# Patient Record
Sex: Male | Born: 1970 | Race: Black or African American | Hispanic: No | Marital: Single | Smoking: Current every day smoker
Health system: Southern US, Community
[De-identification: ages and names within clinical notes are randomized; demographics above are authoritative.]

## PROBLEM LIST (undated history)

## (undated) DIAGNOSIS — Z720 Tobacco use: Secondary | ICD-10-CM

## (undated) DIAGNOSIS — I10 Essential (primary) hypertension: Secondary | ICD-10-CM

---

## 2008-08-28 ENCOUNTER — Emergency Department (HOSPITAL_COMMUNITY): Admission: EM | Admit: 2008-08-28 | Discharge: 2008-08-28 | Payer: Self-pay | Admitting: Emergency Medicine

## 2009-05-30 ENCOUNTER — Emergency Department (HOSPITAL_COMMUNITY): Admission: EM | Admit: 2009-05-30 | Discharge: 2009-05-30 | Payer: Self-pay | Admitting: Family Medicine

## 2016-01-13 ENCOUNTER — Observation Stay (HOSPITAL_COMMUNITY): Payer: Self-pay

## 2016-01-13 ENCOUNTER — Encounter (HOSPITAL_COMMUNITY): Payer: Self-pay

## 2016-01-13 ENCOUNTER — Observation Stay (HOSPITAL_COMMUNITY)
Admission: EM | Admit: 2016-01-13 | Discharge: 2016-01-14 | Disposition: A | Discharge status: Home / Self Care | Payer: Self-pay | Attending: Internal Medicine | Admitting: Internal Medicine

## 2016-01-13 DIAGNOSIS — Z833 Family history of diabetes mellitus: Secondary | ICD-10-CM

## 2016-01-13 DIAGNOSIS — R030 Elevated blood-pressure reading, without diagnosis of hypertension: Secondary | ICD-10-CM

## 2016-01-13 DIAGNOSIS — R609 Edema, unspecified: Secondary | ICD-10-CM

## 2016-01-13 DIAGNOSIS — R7301 Impaired fasting glucose: Secondary | ICD-10-CM | POA: Insufficient documentation

## 2016-01-13 DIAGNOSIS — I10 Essential (primary) hypertension: Secondary | ICD-10-CM | POA: Insufficient documentation

## 2016-01-13 DIAGNOSIS — L0390 Cellulitis, unspecified: Secondary | ICD-10-CM | POA: Diagnosis present

## 2016-01-13 DIAGNOSIS — L03114 Cellulitis of left upper limb: Principal | ICD-10-CM | POA: Insufficient documentation

## 2016-01-13 DIAGNOSIS — B9689 Other specified bacterial agents as the cause of diseases classified elsewhere: Secondary | ICD-10-CM

## 2016-01-13 DIAGNOSIS — F17210 Nicotine dependence, cigarettes, uncomplicated: Secondary | ICD-10-CM | POA: Insufficient documentation

## 2016-01-13 DIAGNOSIS — F17290 Nicotine dependence, other tobacco product, uncomplicated: Secondary | ICD-10-CM

## 2016-01-13 LAB — CBC WITH DIFFERENTIAL/PLATELET
Basophils Absolute: 0 10*3/uL (ref 0.0–0.1)
Basophils Relative: 0 %
Eosinophils Absolute: 0.6 10*3/uL (ref 0.0–0.7)
Eosinophils Relative: 6 %
HEMATOCRIT: 44.5 % (ref 39.0–52.0)
HEMOGLOBIN: 15.2 g/dL (ref 13.0–17.0)
LYMPHS ABS: 1.8 10*3/uL (ref 0.7–4.0)
LYMPHS PCT: 21 %
MCH: 29.3 pg (ref 26.0–34.0)
MCHC: 34.2 g/dL (ref 30.0–36.0)
MCV: 85.7 fL (ref 78.0–100.0)
MONO ABS: 0.4 10*3/uL (ref 0.1–1.0)
MONOS PCT: 4 %
NEUTROS ABS: 6.1 10*3/uL (ref 1.7–7.7)
NEUTROS PCT: 69 %
Platelets: 243 10*3/uL (ref 150–400)
RBC: 5.19 MIL/uL (ref 4.22–5.81)
RDW: 13.4 % (ref 11.5–15.5)
WBC: 8.9 10*3/uL (ref 4.0–10.5)

## 2016-01-13 LAB — COMPREHENSIVE METABOLIC PANEL
ALBUMIN: 3.6 g/dL (ref 3.5–5.0)
ALK PHOS: 70 U/L (ref 38–126)
ALT: 29 U/L (ref 17–63)
ANION GAP: 8 (ref 5–15)
AST: 24 U/L (ref 15–41)
BUN: 13 mg/dL (ref 6–20)
CHLORIDE: 106 mmol/L (ref 101–111)
CO2: 25 mmol/L (ref 22–32)
Calcium: 9.6 mg/dL (ref 8.9–10.3)
Creatinine, Ser: 1.35 mg/dL — ABNORMAL HIGH (ref 0.61–1.24)
GFR calc Af Amer: >60 mL/min (ref >60)
GFR calc non Af Amer: >60 mL/min (ref >60)
GLUCOSE: 171 mg/dL — AB (ref 65–99)
POTASSIUM: 4.2 mmol/L (ref 3.5–5.1)
SODIUM: 139 mmol/L (ref 135–145)
Total Bilirubin: 0.4 mg/dL (ref 0.3–1.2)
Total Protein: 6.4 g/dL — ABNORMAL LOW (ref 6.5–8.1)

## 2016-01-13 LAB — I-STAT CG4 LACTIC ACID, ED: LACTIC ACID, VENOUS: 1.87 mmol/L (ref 0.5–1.9)

## 2016-01-13 MED ORDER — CEFAZOLIN IN D5W 1 GM/50ML IV SOLN
1.0000 g | Freq: Three times a day (TID) | INTRAVENOUS | Status: DC
  Administered 2016-01-13 – 2016-01-14 (×3): 1 g via INTRAVENOUS
  Filled 2016-01-13 (×5): qty 50

## 2016-01-13 MED ORDER — IBUPROFEN 800 MG PO TABS
800.0000 mg | ORAL_TABLET | Freq: Three times a day (TID) | ORAL | Status: DC
  Administered 2016-01-13 – 2016-01-14 (×3): 800 mg via ORAL
  Filled 2016-01-13 (×3): qty 1

## 2016-01-13 MED ORDER — SODIUM CHLORIDE 0.9 % IV SOLN
INTRAVENOUS | Status: AC
  Administered 2016-01-13 – 2016-01-14 (×2): via INTRAVENOUS

## 2016-01-13 MED ORDER — ENOXAPARIN SODIUM 40 MG/0.4ML ~~LOC~~ SOLN
40.0000 mg | SUBCUTANEOUS | Status: DC
  Administered 2016-01-13: 40 mg via SUBCUTANEOUS
  Filled 2016-01-13: qty 0.4

## 2016-01-13 MED ORDER — LIDOCAINE HCL (PF) 1 % IJ SOLN
5.0000 mL | Freq: Once | INTRAMUSCULAR | Status: AC
  Administered 2016-01-13: 5 mL via INTRADERMAL
  Filled 2016-01-13: qty 5

## 2016-01-13 NOTE — H&P (Signed)
Date: 01/13/2016               Patient Name:  Patrick Moody MRN: [id number]  DOB: 01/23/1971 Age / Sex: 45 y.o., male   PCP: No Pcp Per Patient         Medical Service: Internal Medicine Teaching Service         Attending Physician: Dr. Gust RungErik C Hoffman, DO    First Contact: Dr. Thomasene LotJames Britaney Espaillat, MD Pager: [phone]  Second Contact: Dr. Reggie PileVasu Rathore Pager: [phone]       After Hours (After 5p/  First Contact Pager: [phone]  weekends / holidays): Second Contact Pager: [phone]   Chief Complaint: Left hand swelling  History of Present Illness: [name] is a 45 year old African-American male with no significant or contributory past medical history who presents with a 2 day history of left hand swelling and pruritus. The patient states that he first noticed his left hand became itchy 2 days ago. Following this it began to swell up and continued to be pruritic. It continued to swell over the last 48 hours until it started to limit his mobility and hand motion. For the itching the patient tried a Benadryl cream with moderate relief and oral Benadryl with minimal relief. He only has minimal pain. He does not remember being bit by a spider or any insect. He denies being bitten or scratched by a cat. He denies rose gardening. He denies any trauma or new exposures. He does have a tiny abrasion on his hand which could possibly be the source or start of the patient's cellulitis. On review of systems he denies headache, chest pain or shortness of breath. He denies nausea, vomiting or abdominal pain. He denies diarrhea or constipation. He denies fevers, night sweats, weight loss, fatigue or joint pains.  In the emergency department the patient was evaluated and was diagnosed with a left hand cellulitis. Hand surgery was consulted and is aware of the patient. Laboratory evaluation was only significant for an elevated glucose at 171 and creatinine of 1.35. CBC was entirely within normal limits. The patient was  then admitted to the [facility] internal medicine teaching service for IV antibiotics for a left hand cellulitis.  Meds:  Current Meds  Medication Sig  . diphenhydrAMINE (BENADRYL) 25 MG tablet Take 50 mg by mouth once.  . Diphenhydramine-Zinc Acetate (BENADRYL EX) Apply 1 application topically daily as needed (itching/swelling).  Marland Kitchen. ibuprofen (ADVIL,MOTRIN) 200 MG tablet Take 400 mg by mouth every 6 (six) hours as needed for headache (pain).     Allergies: Allergies as of 01/13/2016  . (No Known Allergies)   History reviewed. No pertinent past medical history.  Family History:  1. Diabetes in the patient's mother and father  Social History: Smokes Black and mild, has approximately 2-3 drinks per week denies illicit drug use  Review of Systems: A complete ROS was negative except as per HPI.  Physical Exam: Blood pressure 138/85, pulse 72, temperature 98.4 F (36.9 C), temperature source Oral, resp. rate 18, height 6\' 1"  (1.854 m), weight 198 lb (89.8 kg), SpO2 100 %. Physical Exam  Constitutional: He is oriented to person, place, and time. He appears well-developed and well-nourished.  HENT:  Head: Normocephalic and atraumatic.  Cardiovascular: Normal rate and regular rhythm.  Exam reveals no gallop and no friction rub.   No murmur heard. Respiratory: Breath sounds normal. No respiratory distress. He has no wheezes.  GI: Soft. Bowel sounds are normal. He exhibits no  distension. There is no tenderness.  Musculoskeletal:  Patient's left hand is swollen and tender to palpation. It is also warm to palpation. There is a small incision was done in the emergency department for attempted I&D. Radial pulses intact. Sensation is intact. Motor strength is intact.  Neurological: He is alert and oriented to person, place, and time.  Skin: There is erythema.     EKG: No acute ST segment elevation  CXR: Not obtained  Assessment & Plan by Problem: Active Problems:   Cellulitis  Mr.  Patrick Moody is a [age] showed African-American male with no past medical history who presents with a left hand cellulitis  1. Left hand cellulitis Patient has a 2 day history of pruritic and swollen left hand. There is no inciting event, trauma or exposure. On examination hand is swollen and warm to palpation. Most likely etiology for the patient's left hand swelling and cellulitis. The exact underlying cause predisposing factor that with the cellulitis is unknown. We will treat with IV antibiotics and pain management. -- Cefazolin -- Ibuprofen for pain -- Left hand and forearm diagnostic radiograph  2. Elevated creatinine The patient's creatinine at time of admission was 1.35. Currently, we do notline. We will give him IV hydration overnight and repeat BMP in the morning. -- Normal saline at 100 mL per hour  3. Possible hypertension Patient had a maximum blood pressure 160/112. He has no history of hypertension. This elevated blood pressure may be secondary to pain in the setting of left hand cellulitis. We will not start antihypertensive medication at this time but will continue to monitor. -- Monitor clinically   DVT/PE prophylaxis: Lovenox FEN/GI: Regular diet  Dispo: Admit patient to Observation with expected length of stay less than 2 midnights.  Signed: Thomasene LotJames Samai Corea, MD 01/13/2016, 5:55 PM  Pager: [phone]

## 2016-01-13 NOTE — ED Triage Notes (Signed)
Patient here with left hand redness and swelling x 2 days, thinks he was bitten by spider. Now has swelling up to wrist area, no relief with ice.

## 2016-01-13 NOTE — ED Notes (Signed)
Suture cart and I&D tray to bedside

## 2016-01-13 NOTE — ED Provider Notes (Signed)
[facility]-EMERGENCY DEPT Provider Note   CSN: [id number] Arrival date & time: 01/13/16  1306  By signing my name below, I, Alyssa GroveMartin Green, attest that this documentation has been prepared under the direction and in the presence of Kyrstyn Greear Randall AnLyn Shakthi Scipio, MD. Electronically Signed: Alyssa GroveMartin Green, ED Scribe. 01/13/16. 1:53 PM.   History   Chief Complaint No chief complaint on file.  The history is provided by the patient. No language interpreter was used.   HPI Comments: Patrick Moody is a 45 y.o. male who presents to the Emergency Department complaining of gradual onset, constant left hand swelling onset 2 days. Pt notes area was pruritic to begin with and was exacerbated with application of ice. He is unsure if he was bitten. Pt works  Pt denies fever, nausea, vomiting.  History reviewed. No pertinent past medical history.  Patient Active Problem List   Diagnosis Date Noted  . Cellulitis 01/13/2016    History reviewed. No pertinent surgical history.   Home Medications    Prior to Admission medications   Medication Sig Start Date End Date Taking? Authorizing Provider  diphenhydrAMINE (BENADRYL) 25 MG tablet Take 50 mg by mouth once.   Yes Historical Provider, MD  Diphenhydramine-Zinc Acetate (BENADRYL EX) Apply 1 application topically daily as needed (itching/swelling).   Yes Historical Provider, MD  ibuprofen (ADVIL,MOTRIN) 200 MG tablet Take 400 mg by mouth every 6 (six) hours as needed for headache (pain).   Yes Historical Provider, MD    Family History No family history on file.  Social History Social History  Substance Use Topics  . Smoking status: Current Every Day Smoker  . Smokeless tobacco: Never Used  . Alcohol use Not on file    Allergies   Patient has no known allergies.  Review of Systems Review of Systems  Constitutional: Negative for fever.  Gastrointestinal: Negative for nausea and vomiting.  Musculoskeletal: Positive for joint swelling.  Skin: Negative  for color change.  All other systems reviewed and are negative.  Physical Exam Updated Vital Signs BP 138/85 (BP Location: Right Arm)   Pulse 72   Temp 98.4 F (36.9 C) (Oral)   Resp 18   Ht 6\' 1"  (1.854 m)   Wt 198 lb (89.8 kg)   SpO2 100%   BMI 26.12 kg/m   Physical Exam  Constitutional: He is oriented to person, place, and time. He appears well-developed and well-nourished. He is active. No distress.  HENT:  Head: Normocephalic and atraumatic.  Eyes: Conjunctivae are normal.  Cardiovascular: Normal rate.   Pulmonary/Chest: Effort normal. No respiratory distress.  Musculoskeletal: Normal range of motion.  Neurological: He is alert and oriented to person, place, and time.  Skin: Skin is warm and dry.  Swollen left hand in the web space between the thumb and first finger. Significant swelling with pinpoint crusting.  Psychiatric: He has a normal mood and affect. His behavior is normal.  Nursing note and vitals reviewed.       ED Treatments / Results  DIAGNOSTIC STUDIES: Oxygen Saturation is 100% on RA, normal by my interpretation.    COORDINATION OF CARE: 1:52 PM Discussed treatment plan with pt at bedside which includes ultrasound and pt agreed to plan.  Labs (all labs ordered are listed, but only abnormal results are displayed) Labs Reviewed  COMPREHENSIVE METABOLIC PANEL - Abnormal; Notable for the following:       Result Value   Glucose, Bld 171 (*)    Creatinine, Ser 1.35 (*)    Total  Protein 6.4 (*)    All other components within normal limits  CBC WITH DIFFERENTIAL/PLATELET  I-STAT CG4 LACTIC ACID, ED   EKG  EKG Interpretation None      Radiology No results found.  Procedures Procedures (including critical care time)  Medications Ordered in ED Medications  lidocaine (PF) (XYLOCAINE) 1 % injection 5 mL (5 mLs Intradermal Given 01/13/16 1502)     Initial Impression / Assessment and Plan / ED Course  I have reviewed the triage vital signs  and the nursing notes.  Pertinent labs & imaging results that were available during my care of the patient were reviewed by me and considered in my medical decision making (see chart for details).  Clinical Course as of Jan 12 1634  Wynelle LinkSun Jan 13, 2016  1459 Discussed with hand surgeon on call and will make a small incision over the area that is crusted and will then admit for IV antibiotics   [MG]    Clinical Course User Index [MG] Alyssa GroveMartin Green   I personally performed the services described in this documentation, which was scribed in my presence. The recorded information has been reviewed and is accurate.   Patient is a 45 year old male presenting with hand swelling. Patient's hand swelling has been over the last day and half. Patient says he thought it was a spider bite. Patient has significant swelling to the dorsal aspect of the left hand between the thumb and first finger. Patient has tiny punctate lesion which appears to be the head of an abscess. Ultrasound shows loculated abscess versus cellulitis. No fluctuance. We'll treat with vancomycin. Will discuss with hand surgery. Patient will likely require IV and a Biaxin admission.   Talked to on-call hand physician. He recommened opening the crusted top of abscess.  Will do so, though no fluctuance.    Will admit to medicine given extensive infection, high risk are for infection.   ULTRASOUND LIMITED SOFT TISSUE/ MUSCULOSKELETAL:  Indication: L hand abscess vs cellulitis Linear probe used to evaluate area of interest in two planes. Findings:  loculcated abscess, cobblestoning Performed by: Dr Corlis LeakMacKuen Images saved electronically  INCISION AND DRAINAGE Performed by: Arlana Hoveourteney L Kamila Broda Consent: Verbal consent obtained. Risks and benefits: risks, benefits and alternatives were discussed Type: abscess  Body area: Hand  Anesthesia: local infiltration  Incision was made with a scalpel.  Local anesthetic: lidocaine 1 w   epinephrine  Anesthetic total: 3 ml  Complexity: complex Blunt dissection to break up loculations  Drainage: blood, no purulence   Drainage amount: minimal  Patient tolerance: Patient tolerated the procedure well with no immediate complications.     Final Clinical Impressions(s) / ED Diagnoses   Final diagnoses:  None    New Prescriptions New Prescriptions   No medications on file     Kelie Gainey Randall AnLyn Homer Miller, MD 01/13/16 1635

## 2016-01-13 NOTE — ED Notes (Signed)
MD at bedside for I+D

## 2016-01-14 LAB — BASIC METABOLIC PANEL
ANION GAP: 9 (ref 5–15)
BUN: 12 mg/dL (ref 6–20)
CHLORIDE: 103 mmol/L (ref 101–111)
CO2: 24 mmol/L (ref 22–32)
Calcium: 8.8 mg/dL — ABNORMAL LOW (ref 8.9–10.3)
Creatinine, Ser: 1.39 mg/dL — ABNORMAL HIGH (ref 0.61–1.24)
GFR calc non Af Amer: 59 mL/min — ABNORMAL LOW (ref >60)
GLUCOSE: 105 mg/dL — AB (ref 65–99)
POTASSIUM: 4.1 mmol/L (ref 3.5–5.1)
Sodium: 136 mmol/L (ref 135–145)

## 2016-01-14 MED ORDER — CEPHALEXIN 500 MG PO CAPS: 500.0000 mg | ORAL_CAPSULE | Freq: Four times a day (QID) | ORAL | 0 refills | Status: DC

## 2016-01-14 NOTE — Progress Notes (Signed)
Pt discharged to home.  Discharge instructions explained to pt.  Pt has no questions at the time of discharge.  Pt states he has all belongings.  IV removed.  Pt ambulated off unit on his own.   

## 2016-01-14 NOTE — Progress Notes (Signed)
   Subjective: No acute events overnight. Patient's left hand swelling and cellulitis have improved. He is not in any pain.  Objective:  Vital signs in last 24 hours: Vitals:   01/13/16 1300 01/13/16 1312 01/13/16 1533 01/14/16 0525  BP: (!) 146/109 (!) 160/112 138/85 (!) 142/90  Pulse: 68 74 72 67  Resp:  18 18 18   Temp: 98.2 F (36.8 C) 97.6 F (36.4 C) 98.4 F (36.9 C) 98.4 F (36.9 C)  TempSrc: Oral Oral Oral Oral  SpO2: 93% 100% 100% 99%  Weight:  198 lb (89.8 kg)    Height:  6\' 1"  (1.854 m)     Physical Exam  Constitutional: He is oriented to person, place, and time. He appears well-developed and well-nourished.  HENT:  Head: Normocephalic and atraumatic.  Cardiovascular: Normal rate and regular rhythm.  Exam reveals no gallop and no friction rub.   No murmur heard. Respiratory: Effort normal and breath sounds normal. No respiratory distress. He has no wheezes.  GI: Soft. Bowel sounds are normal. He exhibits no distension. There is no tenderness.  Musculoskeletal: He exhibits edema.  Left hand remains edematous and warm to palpation. Overall, this area of swelling has improved from examination yesterday.  Neurological: He is alert and oriented to person, place, and time.     Assessment/Plan:  Active Problems:   Cellulitis  1. Left hand cellulitis, improved The patient's left hand cellulitis has improved clinically. It remains swollen and warm to palpation but the swelling has improved from prior examinations. He has good pulses and intact sensation and muscle strength. He remains afebrile and without signs of systemic infection. At this time he is medically appropriate for discharge with continued antibiotics and follow-up in clinic. -- Keflex 500 mg 4 times daily for 8 days, this will be a total antibiotic course of 10 days -- Follow-up in the [facility] internal medicine teaching clinic scheduled for 01/16/2016 at 2:15 PM -- Discharged today  2. Hypertension The  patient remains hypertensive. He has not had treatment for this before. He does not have a primary care physician. He will see me in clinic on Wednesday and we will address his cellulitis as well as his high blood pressure at that time.  Dispo: Anticipated discharge today.  Thomasene LotJames Gladine Plude, MD 01/14/2016, 10:33 AM Pager: [phone]

## 2016-01-14 NOTE — Discharge Summary (Signed)
Name: Patrick Moody MRN: [id number] DOB: [birth year] 45 y.o. PCP: No Pcp Per Patient  Date of Admission: 01/13/2016  1:35 PM Date of Discharge: 01/14/2016 Attending Physician: Gust RungErik C Hoffman, DO  Discharge Diagnosis: 1. Left hand cellulitis Active Problems:   Cellulitis   Discharge Medications:   Medication List    TAKE these medications   BENADRYL EX Apply 1 application topically daily as needed (itching/swelling).   cephALEXin 500 MG capsule Commonly known as:  KEFLEX Take 1 capsule (500 mg total) by mouth 4 (four) times daily.   diphenhydrAMINE 25 MG tablet Commonly known as:  BENADRYL Take 50 mg by mouth once.   ibuprofen 200 MG tablet Commonly known as:  ADVIL,MOTRIN Take 400 mg by mouth every 6 (six) hours as needed for headache (pain).       Disposition and follow-up:   Patrick Moody was [facility] in Good condition.  At the hospital follow up visit please address:  1.  Please ensure the patient is continuing to take his antibiotic. Please discuss with the patient if he requires blood pressure medication.  2.  Labs / imaging needed at time of follow-up: None  3.  Pending labs/ test needing follow-up: None  Follow-up Appointments: 1. He has an appointment in the [facility] internal medicine clinic for 01/16/2016 at 2:15 in the afternoon and he will see me.  Hospital Course by problem list: Active Problems:   Cellulitis   1. Left hand cellulitis The patient presented with a 2 day history of left hand pruritus, swelling and tenderness. In the emergency department laboratory work was normal. The patient was afebrile. He was admitted to the [facility] internal medicine teaching service for IV antibiotics. Once admitted the patient was treated with IV cefazolin. After one night his hand appeared improved. He was transitioned to oral cephalexin for 8 days which would be a total of 10 days of antibiotic therapy and discharged  home. He has a follow-up appointment in the [facility] internal medicine teaching clinic on 01/16/2016. I will be his primary care physician and will see him at this hospital follow-up.  2. Hypertension The patient has no diagnosis of hypertension. However, he was hypertensive during his inpatient stay. Please address this in the outpatient follow-up visit. Discharge Vitals:   BP (!) 142/90 (BP Location: Left Arm)   Pulse 67   Temp 98.4 F (36.9 C) (Oral)   Resp 18   Ht 6\' 1"  (1.854 m)   Wt 198 lb (89.8 kg)   SpO2 99%   BMI 26.12 kg/m   Pertinent Labs, Studies, and Procedures:  Left hand radiograph-no evidence of trauma, osteomyelitis or soft tissue damage  Discharge Instructions: Discharge Instructions    Diet - low sodium heart healthy    Complete by:  As directed    Discharge instructions    Complete by:  As directed    We are treating you for a skin infection called cellulitis.   I have started you on an antibiotic called Keflex. You are to take 1 pill four times daily for 8 days. Including the antibiotics you received in the hospital this will make for a total of 10 days.   Please ensure you finish ALL of the antibiotics as this is extremely important!  Also, I have arranged an appointment in the [facility] Internal Medicine teaching Clinic for this Wednesday at 2:15. Please ensure you come to this appointment.   Increase activity slowly  Complete by:  As directed       Signed: Thomasene LotJames Syanna Remmert, MD 01/14/2016, 2:07 PM   Pager: [phone]

## 2016-01-15 ENCOUNTER — Telehealth: Payer: Self-pay | Admitting: General Practice

## 2016-01-15 NOTE — Telephone Encounter (Signed)
APT. REMINDER CALL, LMTCB °

## 2016-01-16 ENCOUNTER — Ambulatory Visit (INDEPENDENT_AMBULATORY_CARE_PROVIDER_SITE_OTHER): Payer: Self-pay | Admitting: Internal Medicine

## 2016-01-16 ENCOUNTER — Encounter: Payer: Self-pay | Admitting: Internal Medicine

## 2016-01-16 VITALS — BP 151/90 | HR 87 | Temp 98.5°F | Ht 73.0 in | Wt 196.2 lb

## 2016-01-16 DIAGNOSIS — F17200 Nicotine dependence, unspecified, uncomplicated: Secondary | ICD-10-CM

## 2016-01-16 DIAGNOSIS — L03114 Cellulitis of left upper limb: Secondary | ICD-10-CM

## 2016-01-16 DIAGNOSIS — B9689 Other specified bacterial agents as the cause of diseases classified elsewhere: Secondary | ICD-10-CM

## 2016-01-16 DIAGNOSIS — I10 Essential (primary) hypertension: Secondary | ICD-10-CM

## 2016-01-16 MED ORDER — NORGESTIM-ETH ESTRAD TRIPHASIC 0.18/0.215/0.25 MG-25 MCG PO TABS: 1.0000 | ORAL_TABLET | Freq: Every day | ORAL | 11 refills | Status: DC

## 2016-01-16 MED ORDER — HYDROCHLOROTHIAZIDE 12.5 MG PO CAPS: 12.5000 mg | ORAL_CAPSULE | Freq: Every day | ORAL | 1 refills | Status: DC

## 2016-01-16 NOTE — Assessment & Plan Note (Signed)
This is a new diagnosis for the patient. He holds no former diagnosis of hypertension. However, while the patient was in the hospital his blood pressures were elevated and there was a concern that he may have underlying benign essential hypertension. At today's visit his blood pressure was 151/90. We discussed the risks and benefits of starting an antihypertensive medication and the patient was amenable to starting therapy. Because the patient is an African-American male I decided to start with low-dose hydrochlorothiazide as this antihypertensive medication has great efficacy in African-American populations, is well-tolerated and has a minimal side effect profile. In addition to starting medication I counseled the patient on lifestyle modifications. Currently he works 2 jobs that are stressful and this could be contributing to his hypertension. Additionally he states that he eats lots of high salt fast foods. We discussed the benefits of lifestyle modification including diet, exercise and eating a healthy diet. He was amenable to try these interventions as well. I will see him back in clinic in approximately 2 months to reassess his hypertension. -- Hydrochlorothiazide 12.5 mg once daily -- Continue lifestyle modification

## 2016-01-16 NOTE — Progress Notes (Signed)
   CC: Hospital follow-up for left hand cellulitis HPI: [name] is a 45 y.o. male with no contributory past medical history who presents for hospital follow-up for left hand cellulitis. Overall, he says that his hand has greatly improved. He has had some loose stools but no gross diarrhea. He denies chest pain or shortness of breath. He denies nausea, vomiting or abdominal pain. He has no additional acute complaints or concerns at today's visit.    Review of Systems: A complete ROS was negative except as per HPI.  Physical Exam: Vitals:   01/16/16 1434  BP: (!) 151/90  Pulse: 87  Temp: 98.5 F (36.9 C)  TempSrc: Oral  SpO2: 100%  Weight: 196 lb 3.2 oz (89 kg)  Height: 6\' 1"  (1.854 m)   BP (!) 151/90 (BP Location: Left Arm, Patient Position: Sitting, Cuff Size: Normal)   Pulse 87   Temp 98.5 F (36.9 C) (Oral)   Ht 6\' 1"  (1.854 m)   Wt 196 lb 3.2 oz (89 kg)   SpO2 100%   BMI 25.89 kg/m  General appearance: alert and cooperative Head: Normocephalic, without obvious abnormality, atraumatic Heart: regular rate and rhythm, S1, S2 normal, no murmur, click, rub or gallop Abdomen: soft, non-tender; bowel sounds normal; no masses,  no organomegaly Lungs: Clear to auscultation bilaterally MSK: The patient's left hand remains minimally swollen. There is no tenderness to palpation. There is no erythema or warmth appreciated on examination. Much improved from prior examination in the hospital.  Assessment & Plan:  See encounters tab for problem based medical decision making. Patient seen with Dr. Rogelia BogaButcher  Signed: Thomasene LotJames Clyde Zarrella, MD 01/16/2016, 3:39 PM  Pager: [phone]

## 2016-01-16 NOTE — Assessment & Plan Note (Signed)
Patient presents as hospital follow up for left hand cellulitis. He was admitted and stayed 1 night for IV antibiotics. He was discharged on an extended course of cephalexin. I personally took care of this patient while he was in the hospital earlier this week. His left hand cellulitis is greatly improved from prior examinations. He is tolerating the oral antibiotic well without many side effects. He does endorse mild increase of loose stools but denies diarrhea, nausea, vomiting or abdominal pain. On physical examination the cellulitis is no longer warm to touch. There is no tenderness to palpation. The patient has full range of motion and use of his left hand. He will continue and finish his antibiotic course. -- Complete cephalexin 500 mg 4 times daily

## 2016-01-16 NOTE — Patient Instructions (Signed)
It was a pleasure seeing you today. [facility] for [facility] needs.   Please finish your antibiotic.  I started a new medication for your blood pressure called hydrochlorothiazide. You are to take 1 pill once a day. Please refrain from eating salty foods. Please work on your stress level. Continue to work on diet and exercise. Please make an appointment to return to clinic in 2 months to follow-up on your blood pressure.

## 2016-01-17 NOTE — Progress Notes (Signed)
Internal Medicine Clinic Attending  I saw and evaluated the patient.  I personally confirmed the key portions of the history and exam documented by Dr. Taylor and I reviewed pertinent patient test results.  The assessment, diagnosis, and plan were formulated together and I agree with the documentation in the resident's note.  

## 2016-07-21 ENCOUNTER — Encounter: Payer: Self-pay | Admitting: *Deleted

## 2017-07-19 ENCOUNTER — Other Ambulatory Visit: Payer: Self-pay

## 2017-07-19 ENCOUNTER — Emergency Department (HOSPITAL_COMMUNITY)
Admission: EM | Admit: 2017-07-19 | Discharge: 2017-07-19 | Disposition: A | Discharge status: Home / Self Care | Payer: No Typology Code available for payment source | Attending: Emergency Medicine | Admitting: Emergency Medicine

## 2017-07-19 ENCOUNTER — Encounter (HOSPITAL_COMMUNITY): Payer: Self-pay | Admitting: Emergency Medicine

## 2017-07-19 DIAGNOSIS — Z23 Encounter for immunization: Secondary | ICD-10-CM | POA: Diagnosis not present

## 2017-07-19 DIAGNOSIS — Y999 Unspecified external cause status: Secondary | ICD-10-CM | POA: Insufficient documentation

## 2017-07-19 DIAGNOSIS — S0990XA Unspecified injury of head, initial encounter: Secondary | ICD-10-CM | POA: Diagnosis present

## 2017-07-19 DIAGNOSIS — F17200 Nicotine dependence, unspecified, uncomplicated: Secondary | ICD-10-CM | POA: Insufficient documentation

## 2017-07-19 DIAGNOSIS — Y9389 Activity, other specified: Secondary | ICD-10-CM | POA: Insufficient documentation

## 2017-07-19 DIAGNOSIS — Z79899 Other long term (current) drug therapy: Secondary | ICD-10-CM | POA: Insufficient documentation

## 2017-07-19 DIAGNOSIS — I10 Essential (primary) hypertension: Secondary | ICD-10-CM | POA: Insufficient documentation

## 2017-07-19 DIAGNOSIS — Y929 Unspecified place or not applicable: Secondary | ICD-10-CM | POA: Diagnosis not present

## 2017-07-19 DIAGNOSIS — S0104XA Puncture wound with foreign body of scalp, initial encounter: Secondary | ICD-10-CM | POA: Diagnosis not present

## 2017-07-19 MED ORDER — IBUPROFEN 400 MG PO TABS
600.0000 mg | ORAL_TABLET | Freq: Once | ORAL | Status: AC
  Administered 2017-07-19: 600 mg via ORAL
  Filled 2017-07-19: qty 1

## 2017-07-19 MED ORDER — TETANUS-DIPHTH-ACELL PERTUSSIS 5-2.5-18.5 LF-MCG/0.5 IM SUSP
0.5000 mL | Freq: Once | INTRAMUSCULAR | Status: AC
  Administered 2017-07-19: 0.5 mL via INTRAMUSCULAR
  Filled 2017-07-19: qty 0.5

## 2017-07-19 MED ORDER — CYCLOBENZAPRINE HCL 10 MG PO TABS: 10.0000 mg | ORAL_TABLET | Freq: Two times a day (BID) | ORAL | 0 refills | Status: DC | PRN
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
## 2017-07-19 NOTE — ED Notes (Signed)
ED Provider at bedside. 
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
## 2018-08-16 ENCOUNTER — Other Ambulatory Visit: Payer: Self-pay

## 2018-08-16 ENCOUNTER — Emergency Department (HOSPITAL_COMMUNITY): Payer: BC Managed Care – PPO

## 2018-08-16 ENCOUNTER — Inpatient Hospital Stay (HOSPITAL_COMMUNITY)
Admission: EM | Admit: 2018-08-16 | Discharge: 2018-08-19 | DRG: 637 | Disposition: A | Discharge status: Home / Self Care | Payer: BC Managed Care – PPO | Attending: Internal Medicine | Admitting: Internal Medicine

## 2018-08-16 ENCOUNTER — Encounter (HOSPITAL_COMMUNITY): Payer: Self-pay | Admitting: Emergency Medicine

## 2018-08-16 DIAGNOSIS — M6282 Rhabdomyolysis: Secondary | ICD-10-CM | POA: Diagnosis present

## 2018-08-16 DIAGNOSIS — E119 Type 2 diabetes mellitus without complications: Secondary | ICD-10-CM

## 2018-08-16 DIAGNOSIS — E872 Acidosis, unspecified: Secondary | ICD-10-CM | POA: Diagnosis present

## 2018-08-16 DIAGNOSIS — Z79899 Other long term (current) drug therapy: Secondary | ICD-10-CM

## 2018-08-16 DIAGNOSIS — E860 Dehydration: Secondary | ICD-10-CM

## 2018-08-16 DIAGNOSIS — E1010 Type 1 diabetes mellitus with ketoacidosis without coma: Secondary | ICD-10-CM | POA: Diagnosis not present

## 2018-08-16 DIAGNOSIS — I10 Essential (primary) hypertension: Secondary | ICD-10-CM | POA: Diagnosis present

## 2018-08-16 DIAGNOSIS — E1110 Type 2 diabetes mellitus with ketoacidosis without coma: Secondary | ICD-10-CM | POA: Diagnosis not present

## 2018-08-16 DIAGNOSIS — Z8249 Family history of ischemic heart disease and other diseases of the circulatory system: Secondary | ICD-10-CM | POA: Diagnosis not present

## 2018-08-16 DIAGNOSIS — F17200 Nicotine dependence, unspecified, uncomplicated: Secondary | ICD-10-CM | POA: Diagnosis present

## 2018-08-16 DIAGNOSIS — E870 Hyperosmolality and hypernatremia: Secondary | ICD-10-CM | POA: Diagnosis present

## 2018-08-16 DIAGNOSIS — Z833 Family history of diabetes mellitus: Secondary | ICD-10-CM | POA: Diagnosis not present

## 2018-08-16 DIAGNOSIS — Z791 Long term (current) use of non-steroidal anti-inflammatories (NSAID): Secondary | ICD-10-CM

## 2018-08-16 DIAGNOSIS — E1011 Type 1 diabetes mellitus with ketoacidosis with coma: Secondary | ICD-10-CM | POA: Diagnosis not present

## 2018-08-16 DIAGNOSIS — D72829 Elevated white blood cell count, unspecified: Secondary | ICD-10-CM | POA: Diagnosis present

## 2018-08-16 DIAGNOSIS — E785 Hyperlipidemia, unspecified: Secondary | ICD-10-CM | POA: Diagnosis present

## 2018-08-16 DIAGNOSIS — G9341 Metabolic encephalopathy: Secondary | ICD-10-CM

## 2018-08-16 DIAGNOSIS — N179 Acute kidney failure, unspecified: Secondary | ICD-10-CM | POA: Diagnosis present

## 2018-08-16 DIAGNOSIS — Z1159 Encounter for screening for other viral diseases: Secondary | ICD-10-CM | POA: Diagnosis not present

## 2018-08-16 DIAGNOSIS — R739 Hyperglycemia, unspecified: Secondary | ICD-10-CM | POA: Diagnosis present

## 2018-08-16 DIAGNOSIS — Z720 Tobacco use: Secondary | ICD-10-CM | POA: Diagnosis present

## 2018-08-16 DIAGNOSIS — E861 Hypovolemia: Secondary | ICD-10-CM | POA: Diagnosis present

## 2018-08-16 DIAGNOSIS — E871 Hypo-osmolality and hyponatremia: Secondary | ICD-10-CM | POA: Diagnosis present

## 2018-08-16 HISTORY — DX: Essential (primary) hypertension: I10

## 2018-08-16 HISTORY — DX: Tobacco use: Z72.0

## 2018-08-16 LAB — COMPREHENSIVE METABOLIC PANEL
ALT: 42 U/L (ref 0–44)
AST: 26 U/L (ref 15–41)
Albumin: 4.7 g/dL (ref 3.5–5.0)
Alkaline Phosphatase: 146 U/L — ABNORMAL HIGH (ref 38–126)
Anion gap: 24 — ABNORMAL HIGH (ref 5–15)
BUN: 60 mg/dL — ABNORMAL HIGH (ref 6–20)
CO2: 19 mmol/L — ABNORMAL LOW (ref 22–32)
Calcium: 10.6 mg/dL — ABNORMAL HIGH (ref 8.9–10.3)
Chloride: 97 mmol/L — ABNORMAL LOW (ref 98–111)
Creatinine, Ser: 3.15 mg/dL — ABNORMAL HIGH (ref 0.61–1.24)
GFR calc Af Amer: 26 mL/min — ABNORMAL LOW (ref >60)
GFR calc non Af Amer: 22 mL/min — ABNORMAL LOW (ref >60)
Glucose, Bld: 1128 mg/dL (ref 70–99)
Potassium: 5.2 mmol/L — ABNORMAL HIGH (ref 3.5–5.1)
Sodium: 140 mmol/L (ref 135–145)
Total Bilirubin: 1.3 mg/dL — ABNORMAL HIGH (ref 0.3–1.2)
Total Protein: 9.6 g/dL — ABNORMAL HIGH (ref 6.5–8.1)

## 2018-08-16 LAB — CBC WITH DIFFERENTIAL/PLATELET
Abs Immature Granulocytes: 0.05 10*3/uL (ref 0.00–0.07)
Basophils Absolute: 0 10*3/uL (ref 0.0–0.1)
Basophils Relative: 0 %
Eosinophils Absolute: 0 10*3/uL (ref 0.0–0.5)
Eosinophils Relative: 0 %
HCT: 56.7 % — ABNORMAL HIGH (ref 39.0–52.0)
Hemoglobin: 16.9 g/dL (ref 13.0–17.0)
Immature Granulocytes: 0 %
Lymphocytes Relative: 7 %
Lymphs Abs: 0.9 10*3/uL (ref 0.7–4.0)
MCH: 26.7 pg (ref 26.0–34.0)
MCHC: 29.8 g/dL — ABNORMAL LOW (ref 30.0–36.0)
MCV: 89.7 fL (ref 80.0–100.0)
Monocytes Absolute: 0.7 10*3/uL (ref 0.1–1.0)
Monocytes Relative: 5 %
Neutro Abs: 11.1 10*3/uL — ABNORMAL HIGH (ref 1.7–7.7)
Neutrophils Relative %: 88 %
Platelets: 463 10*3/uL — ABNORMAL HIGH (ref 150–400)
RBC: 6.32 MIL/uL — ABNORMAL HIGH (ref 4.22–5.81)
RDW: 12.6 % (ref 11.5–15.5)
WBC: 12.8 10*3/uL — ABNORMAL HIGH (ref 4.0–10.5)
nRBC: 0 % (ref 0.0–0.2)

## 2018-08-16 LAB — BASIC METABOLIC PANEL
Anion gap: 21 — ABNORMAL HIGH (ref 5–15)
BUN: 58 mg/dL — ABNORMAL HIGH (ref 6–20)
CO2: 15 mmol/L — ABNORMAL LOW (ref 22–32)
Calcium: 9.2 mg/dL (ref 8.9–10.3)
Chloride: 113 mmol/L — ABNORMAL HIGH (ref 98–111)
Creatinine, Ser: 2.68 mg/dL — ABNORMAL HIGH (ref 0.61–1.24)
GFR calc Af Amer: 31 mL/min — ABNORMAL LOW (ref >60)
GFR calc non Af Amer: 27 mL/min — ABNORMAL LOW (ref >60)
Glucose, Bld: 820 mg/dL (ref 70–99)
Potassium: 4.8 mmol/L (ref 3.5–5.1)
Sodium: 149 mmol/L — ABNORMAL HIGH (ref 135–145)

## 2018-08-16 LAB — BLOOD GAS, ARTERIAL
Acid-base deficit: 10.4 mmol/L — ABNORMAL HIGH (ref 0.0–2.0)
Bicarbonate: 16 mmol/L — ABNORMAL LOW (ref 20.0–28.0)
Drawn by: 441261
FIO2: 21
O2 Saturation: 92.9 %
Patient temperature: 98.6
pCO2 arterial: 37.9 mmHg (ref 32.0–48.0)
pH, Arterial: 7.25 — ABNORMAL LOW (ref 7.350–7.450)
pO2, Arterial: 77.3 mmHg — ABNORMAL LOW (ref 83.0–108.0)

## 2018-08-16 LAB — URINALYSIS, ROUTINE W REFLEX MICROSCOPIC
Bilirubin Urine: NEGATIVE
Glucose, UA: >=500 mg/dL — AB
Ketones, ur: 5 mg/dL — AB
Leukocytes,Ua: NEGATIVE
Nitrite: NEGATIVE
Protein, ur: 100 mg/dL — AB
Specific Gravity, Urine: 1.029 (ref 1.005–1.030)
pH: 5 (ref 5.0–8.0)

## 2018-08-16 LAB — GLUCOSE, CAPILLARY
Glucose-Capillary: >600 mg/dL (ref 70–99)
Glucose-Capillary: >600 mg/dL (ref 70–99)
Glucose-Capillary: >600 mg/dL (ref 70–99)

## 2018-08-16 LAB — I-STAT CHEM 8, ED
BUN: 57 mg/dL — ABNORMAL HIGH (ref 6–20)
Calcium, Ion: 1.25 mmol/L (ref 1.15–1.40)
Chloride: 107 mmol/L (ref 98–111)
Creatinine, Ser: 2.8 mg/dL — ABNORMAL HIGH (ref 0.61–1.24)
Glucose, Bld: >700 mg/dL (ref 70–99)
HCT: 52 % (ref 39.0–52.0)
Hemoglobin: 17.7 g/dL — ABNORMAL HIGH (ref 13.0–17.0)
Potassium: 5.1 mmol/L (ref 3.5–5.1)
Sodium: 140 mmol/L (ref 135–145)
TCO2: 21 mmol/L — ABNORMAL LOW (ref 22–32)

## 2018-08-16 LAB — PROTIME-INR
INR: 1.1 (ref 0.8–1.2)
Prothrombin Time: 13.7 seconds (ref 11.4–15.2)

## 2018-08-16 LAB — PROCALCITONIN: Procalcitonin: 0.53 ng/mL

## 2018-08-16 LAB — RAPID URINE DRUG SCREEN, HOSP PERFORMED
Amphetamines: NOT DETECTED
Barbiturates: NOT DETECTED
Benzodiazepines: NOT DETECTED
Cocaine: NOT DETECTED
Opiates: NOT DETECTED
Tetrahydrocannabinol: NOT DETECTED

## 2018-08-16 LAB — ETHANOL: Alcohol, Ethyl (B): <10 mg/dL (ref <10)

## 2018-08-16 LAB — LACTIC ACID, PLASMA
Lactic Acid, Venous: 4 mmol/L (ref 0.5–1.9)
Lactic Acid, Venous: 3.6 mmol/L (ref 0.5–1.9)
Lactic Acid, Venous: 2.8 mmol/L (ref 0.5–1.9)

## 2018-08-16 LAB — LIPASE, BLOOD: Lipase: 48 U/L (ref 11–51)

## 2018-08-16 LAB — CBG MONITORING, ED
Glucose-Capillary: >600 mg/dL (ref 70–99)
Glucose-Capillary: >600 mg/dL (ref 70–99)

## 2018-08-16 LAB — MRSA PCR SCREENING: MRSA by PCR: NEGATIVE

## 2018-08-16 LAB — SARS CORONAVIRUS 2 BY RT PCR (HOSPITAL ORDER, PERFORMED IN ~~LOC~~ HOSPITAL LAB): SARS Coronavirus 2: NEGATIVE

## 2018-08-16 LAB — CK: Total CK: 1269 U/L — ABNORMAL HIGH (ref 49–397)

## 2018-08-16 MED ORDER — LACTATED RINGERS IV BOLUS
1000.0000 mL | Freq: Once | INTRAVENOUS | Status: AC
  Administered 2018-08-17: 1000 mL via INTRAVENOUS

## 2018-08-16 MED ORDER — SODIUM CHLORIDE 0.9 % IV SOLN
INTRAVENOUS | Status: DC
  Administered 2018-08-16: 22:00:00 via INTRAVENOUS

## 2018-08-16 MED ORDER — DEXTROSE-NACL 5-0.45 % IV SOLN: INTRAVENOUS | Status: DC

## 2018-08-16 MED ORDER — HYDRALAZINE HCL 20 MG/ML IJ SOLN: 5.0000 mg | INTRAMUSCULAR | Status: DC | PRN

## 2018-08-16 MED ORDER — SODIUM CHLORIDE 0.9 % IV BOLUS
2000.0000 mL | Freq: Once | INTRAVENOUS | Status: AC
  Administered 2018-08-16: 2000 mL via INTRAVENOUS

## 2018-08-16 MED ORDER — SODIUM CHLORIDE 0.9 % IV BOLUS
1000.0000 mL | Freq: Once | INTRAVENOUS | Status: AC
  Administered 2018-08-16: 1000 mL via INTRAVENOUS

## 2018-08-16 MED ORDER — INSULIN REGULAR(HUMAN) IN NACL 100-0.9 UT/100ML-% IV SOLN: INTRAVENOUS | Status: DC

## 2018-08-16 MED ORDER — HEPARIN SODIUM (PORCINE) 5000 UNIT/ML IJ SOLN
5000.0000 [IU] | Freq: Three times a day (TID) | INTRAMUSCULAR | Status: DC
  Administered 2018-08-16 – 2018-08-19 (×8): 5000 [IU] via SUBCUTANEOUS
  Filled 2018-08-16 (×10): qty 1

## 2018-08-16 MED ORDER — NICOTINE 21 MG/24HR TD PT24
21.0000 mg | MEDICATED_PATCH | Freq: Every day | TRANSDERMAL | Status: DC
  Administered 2018-08-17 – 2018-08-19 (×3): 21 mg via TRANSDERMAL
  Filled 2018-08-16 (×3): qty 1

## 2018-08-16 MED ORDER — SODIUM CHLORIDE 0.9 % IV BOLUS
500.0000 mL | Freq: Once | INTRAVENOUS | Status: AC
  Administered 2018-08-16: 500 mL via INTRAVENOUS

## 2018-08-16 MED ORDER — ACETAMINOPHEN 650 MG RE SUPP: 650.0000 mg | Freq: Four times a day (QID) | RECTAL | Status: DC | PRN

## 2018-08-16 MED ORDER — ONDANSETRON HCL 4 MG/2ML IJ SOLN
4.0000 mg | Freq: Three times a day (TID) | INTRAMUSCULAR | Status: DC | PRN
  Administered 2018-08-17: 4 mg via INTRAVENOUS
  Filled 2018-08-16: qty 2

## 2018-08-16 MED ORDER — INSULIN REGULAR(HUMAN) IN NACL 100-0.9 UT/100ML-% IV SOLN
INTRAVENOUS | Status: DC
  Administered 2018-08-16: 5.4 [IU]/h via INTRAVENOUS
  Filled 2018-08-16 (×3): qty 100

## 2018-08-16 NOTE — ED Notes (Signed)
ED TO INPATIENT HANDOFF REPORT  Name/Age/Gender Patrick Moody 48 y.o. male  Code Status    Code Status Orders  (From admission, onward)         Start     Ordered   08/16/18 1922  Full code  Continuous     08/16/18 1922        Code Status History    Date Active Date Inactive Code Status Order ID Comments User Context   01/13/2016 1743 01/14/2016 1917 Full Code [id number]  [name], Vasundhra, MD Inpatient   Advance Care Planning Activity      Home/SNF/Other Home  Chief Complaint weakness  Level of Care/Admitting Diagnosis ED Disposition    ED Disposition Condition Comment   Admit  [facility] [[id number]]  Level of Care: Stepdown [14]  Admit to SDU based on following criteria: Other see comments  Comments: DKA  Covid Evaluation: Asymptomatic Screening Protocol (No Symptoms)  Diagnosis: DKA, type 1 [facility](HCC) [[id number]][[id number]]  Admitting Physician: Santiago BumpersNIU, XILIN [[id number]]  Attending Physician: Lorretta HarpNIU, XILIN [phone][[id number]]  Estimated length of stay: past midnight tomorrow  Certification:: I certify this patient will need inpatient services for at least 2 midnights  PT Class (Do Not Modify): Inpatient [101]  PT Acc Code (Do Not Modify): Private [1]       Medical History Past Medical History:  Diagnosis Date  . Hypertension   . Tobacco abuse     Allergies No Known Allergies  IV Location/Drains/Wounds Patient Lines/Drains/Airways Status   Active Line/Drains/Airways    Name:   Placement date:   Placement time:   Site:   Days:   Peripheral IV 08/16/18 Right Antecubital   08/16/18    1728    Antecubital   less than 1          Labs/Imaging Results for orders placed or performed during the hospital encounter of 08/16/18 (from the past 48 hour(s))  CBG monitoring, ED     Status: Abnormal   Collection Time: 08/16/18  5:02 PM  Result Value Ref Range   Glucose-Capillary >600 (HH) 70 - 99 mg/dL  Blood gas, arterial     Status: Abnormal   Collection Time:  08/16/18  5:27 PM  Result Value Ref Range   FIO2 21.00    Delivery systems ROOM AIR    pH, Arterial 7.250 (L) 7.350 - 7.450   pCO2 arterial 37.9 32.0 - 48.0 mmHg   pO2, Arterial 77.3 (L) 83.0 - 108.0 mmHg   Bicarbonate 16.0 (L) 20.0 - 28.0 mmol/L   Acid-base deficit 10.4 (H) 0.0 - 2.0 mmol/L   O2 Saturation 92.9 %   Patient temperature 98.6    Collection site LEFT BRACHIAL    Drawn by [phone]    Sample type ARTERIAL DRAW    Allens test (pass/fail) PASS PASS    Comment: Performed at [facility], [address] [place], [place] [address]  Comprehensive metabolic panel     Status: Abnormal   Collection Time: 08/16/18  5:29 PM  Result Value Ref Range   Sodium 140 135 - 145 mmol/L   Potassium 5.2 (H) 3.5 - 5.1 mmol/L   Chloride 97 (L) 98 - 111 mmol/L   CO2 19 (L) 22 - 32 mmol/L   Glucose, Bld 1,128 (HH) 70 - 99 mg/dL    Comment: CRITICAL RESULT CALLED TO, READ BACK BY AND VERIFIED WITH: Claretta FraiseMORGAN ROSSER,RN [phone] @ 1842 BY J SCOTTON    BUN 60 (H) 6 - 20 mg/dL  Creatinine, Ser 3.15 (H) 0.61 - 1.24 mg/dL   Calcium 16.1 (H) 8.9 - 10.3 mg/dL   Total Protein 9.6 (H) 6.5 - 8.1 g/dL   Albumin 4.7 3.5 - 5.0 g/dL   AST 26 15 - 41 U/L   ALT 42 0 - 44 U/L   Alkaline Phosphatase 146 (H) 38 - 126 U/L   Total Bilirubin 1.3 (H) 0.3 - 1.2 mg/dL   GFR calc non Af Amer 22 (L) >60 mL/min   GFR calc Af Amer 26 (L) >60 mL/min   Anion gap 24 (H) 5 - 15    Comment: RESULT CHECKED Performed at [facility], [address], [place], [place] [address]   CBC with Differential     Status: Abnormal   Collection Time: 08/16/18  5:29 PM  Result Value Ref Range   WBC 12.8 (H) 4.0 - 10.5 K/uL   RBC 6.32 (H) 4.22 - 5.81 MIL/uL   Hemoglobin 16.9 13.0 - 17.0 g/dL   HCT 54.0 (H) 98.1 - 19.1 %   MCV 89.7 80.0 - 100.0 fL   MCH 26.7 26.0 - 34.0 pg   MCHC 29.8 (L) 30.0 - 36.0 g/dL   RDW 47.8 29.5 - 62.1 %   Platelets 463 (H) 150 - 400 K/uL   nRBC 0.0 0.0 - 0.2 %    Neutrophils Relative % 88 %   Neutro Abs 11.1 (H) 1.7 - 7.7 K/uL   Lymphocytes Relative 7 %   Lymphs Abs 0.9 0.7 - 4.0 K/uL   Monocytes Relative 5 %   Monocytes Absolute 0.7 0.1 - 1.0 K/uL   Eosinophils Relative 0 %   Eosinophils Absolute 0.0 0.0 - 0.5 K/uL   Basophils Relative 0 %   Basophils Absolute 0.0 0.0 - 0.1 K/uL   Immature Granulocytes 0 %   Abs Immature Granulocytes 0.05 0.00 - 0.07 K/uL    Comment: Performed at [facility], [address], [place], [place] [address]  Lactic acid, plasma     Status: Abnormal   Collection Time: 08/16/18  5:29 PM  Result Value Ref Range   Lactic Acid, Venous 4.0 (HH) 0.5 - 1.9 mmol/L    Comment: CRITICAL RESULT CALLED TO, READ BACK BY AND VERIFIED WITH: Claretta Fraise [id number] @ 1816 BY Knute Neu Performed at [facility], [address] [place], [place] [address]   Protime-INR     Status: None   Collection Time: 08/16/18  5:29 PM  Result Value Ref Range   Prothrombin Time 13.7 11.4 - 15.2 seconds   INR 1.1 0.8 - 1.2    Comment: (NOTE) INR goal varies based on device and disease states. Performed at [facility], [address], [place], [place] [address]   Ethanol     Status: None   Collection Time: 08/16/18  5:30 PM  Result Value Ref Range   Alcohol, Ethyl (B) <10 <10 mg/dL    Comment: (NOTE) Lowest detectable limit for serum alcohol is 10 mg/dL. For medical purposes only. Performed at [facility], [address] [place], [place] [address]   I-stat chem 8, ED (not at [facility] or [facility])     Status: Abnormal   Collection Time: 08/16/18  5:46 PM  Result Value Ref Range   Sodium 140 135 - 145 mmol/L   Potassium 5.1 3.5 - 5.1 mmol/L   Chloride 107 98 - 111 mmol/L   BUN 57 (H) 6 - 20 mg/dL  Creatinine, Ser 2.80 (H) 0.61 - 1.24 mg/dL   Glucose, Bld >409>700 (HH) 70 - 99 mg/dL   Calcium, Ion 8.111.25 9.141.15 - 1.40 mmol/L   TCO2 21 (L) 22 - 32 mmol/L   Hemoglobin  17.7 (H) 13.0 - 17.0 g/dL   HCT 78.252.0 95.639.0 - 21.352.0 %   Comment NOTIFIED PHYSICIAN   Urine rapid drug screen (hosp performed)     Status: None   Collection Time: 08/16/18  6:00 PM  Result Value Ref Range   Opiates NONE DETECTED NONE DETECTED   Cocaine NONE DETECTED NONE DETECTED   Benzodiazepines NONE DETECTED NONE DETECTED   Amphetamines NONE DETECTED NONE DETECTED   Tetrahydrocannabinol NONE DETECTED NONE DETECTED   Barbiturates NONE DETECTED NONE DETECTED    Comment: (NOTE) DRUG SCREEN FOR MEDICAL PURPOSES ONLY.  IF CONFIRMATION IS NEEDED FOR ANY PURPOSE, NOTIFY LAB WITHIN 5 DAYS. LOWEST DETECTABLE LIMITS FOR URINE DRUG SCREEN Drug Class                     Cutoff (ng/mL) Amphetamine and metabolites    1000 Barbiturate and metabolites    200 Benzodiazepine                 200 Tricyclics and metabolites     300 Opiates and metabolites        300 Cocaine and metabolites        300 THC                            50 Performed at [facility], [address] [place], [place] [address]   Urinalysis, Routine w reflex microscopic     Status: Abnormal   Collection Time: 08/16/18  6:00 PM  Result Value Ref Range   Color, Urine YELLOW YELLOW   APPearance CLEAR CLEAR   Specific Gravity, Urine 1.029 1.005 - 1.030   pH 5.0 5.0 - 8.0   Glucose, UA >=500 (A) NEGATIVE mg/dL   Hgb urine dipstick LARGE (A) NEGATIVE   Bilirubin Urine NEGATIVE NEGATIVE   Ketones, ur 5 (A) NEGATIVE mg/dL   Protein, ur 846100 (A) NEGATIVE mg/dL   Nitrite NEGATIVE NEGATIVE   Leukocytes,Ua NEGATIVE NEGATIVE   RBC / HPF 0-5 0 - 5 RBC/hpf   WBC, UA 0-5 0 - 5 WBC/hpf   Bacteria, UA RARE (A) NONE SEEN   Squamous Epithelial / LPF 0-5 0 - 5   Mucus PRESENT    Hyaline Casts, UA PRESENT     Comment: Performed at [facility], [address] [place], [place] [address]  SARS Coronavirus 2 (CEPHEID - Performed in [facility] hospital lab), Hosp Order     Status: None   Collection  Time: 08/16/18  6:03 PM   Specimen: Nasopharyngeal Swab  Result Value Ref Range   SARS Coronavirus 2 NEGATIVE NEGATIVE    Comment: (NOTE) If result is NEGATIVE SARS-CoV-2 target nucleic acids are NOT DETECTED. The SARS-CoV-2 RNA is generally detectable in upper and lower  respiratory specimens during the acute phase of infection. The lowest  concentration of SARS-CoV-2 viral copies this assay can detect is 250  copies / mL. A negative result does not preclude SARS-CoV-2 infection  and should not be used as the sole basis for treatment or other  patient management decisions.  A negative result may occur with  improper specimen collection / handling, submission of specimen other  than nasopharyngeal swab,  presence of viral mutation(s) within the  areas targeted by this assay, and inadequate number of viral copies  (<250 copies / mL). A negative result must be combined with clinical  observations, patient history, and epidemiological information. If result is POSITIVE SARS-CoV-2 target nucleic acids are DETECTED. The SARS-CoV-2 RNA is generally detectable in upper and lower  respiratory specimens dur ing the acute phase of infection.  Positive  results are indicative of active infection with SARS-CoV-2.  Clinical  correlation with patient history and other diagnostic information is  necessary to determine patient infection status.  Positive results do  not rule out bacterial infection or co-infection with other viruses. If result is PRESUMPTIVE POSTIVE SARS-CoV-2 nucleic acids MAY BE PRESENT.   A presumptive positive result was obtained on the submitted specimen  and confirmed on repeat testing.  While 2019 novel coronavirus  (SARS-CoV-2) nucleic acids may be present in the submitted sample  additional confirmatory testing may be necessary for epidemiological  and / or clinical management purposes  to differentiate between  SARS-CoV-2 and other Sarbecovirus currently known to infect  humans.  If clinically indicated additional testing with an alternate test  methodology [phone]([id number]) is advised. The SARS-CoV-2 RNA is generally  detectable in upper and lower respiratory sp ecimens during the acute  phase of infection. The expected result is Negative. Fact Sheet for Patients:  BoilerBrush.com.cyhttps://www.fda.gov/media/136312/download Fact Sheet for Healthcare Providers: https://pope.com/https://www.fda.gov/media/136313/download This test is not yet approved or cleared by the [place] FDA and has been authorized for detection and/or diagnosis of SARS-CoV-2 by FDA under an Emergency Use Authorization (EUA).  This EUA will remain in effect (meaning this test can be used) for the duration of the COVID-19 declaration under Section 564(b)(1) of the Act, 21 U.S.C. section 360bbb-3(b)(1), unless the authorization is terminated or revoked sooner. Performed at [facility], [address] [place], [place] [address]   CBG monitoring, ED     Status: Abnormal   Collection Time: 08/16/18  8:06 PM  Result Value Ref Range   Glucose-Capillary >600 (HH) 70 - 99 mg/dL   Comment 1 Notify RN    Ct Head Wo Contrast  Result Date: 08/16/2018 CLINICAL DATA:  Drowsiness and disorientation. EXAM: CT HEAD WITHOUT CONTRAST TECHNIQUE: Contiguous axial images were obtained from the base of the skull through the vertex without intravenous contrast. COMPARISON:  None. FINDINGS: Brain: No evidence of acute infarction, hemorrhage, hydrocephalus, extra-axial collection or mass lesion/mass effect. Vascular: No hyperdense vessel or unexpected calcification. Skull: Normal. Negative for fracture or focal lesion. Sinuses/Orbits: No acute finding. Other: None. IMPRESSION: No acute intracranial abnormality. Electronically Signed   By: Ted Mcalpineobrinka  Dimitrova M.D.   On: 08/16/2018 18:51   Dg Chest Port 1 View  Result Date: 08/16/2018 CLINICAL DATA:  Dyspnea EXAM: PORTABLE CHEST 1 VIEW COMPARISON:  None. FINDINGS: The heart  size and mediastinal contours are within normal limits. Both lungs are clear. The visualized skeletal structures are unremarkable. IMPRESSION: No active disease. Electronically Signed   By: Alcide CleverMark  Lukens M.D.   On: 08/16/2018 17:56    Pending Labs Unresulted Labs (From admission, onward)    Start     Ordered   08/16/18 1928  Lactic acid, plasma  STAT Now then every 3 hours,   STAT     08/16/18 1927   08/16/18 1928  Procalcitonin  ONCE - STAT,   STAT     08/16/18 1927   08/16/18 1922  HIV antibody (Routine Testing)  Once,   STAT  08/16/18 1922   08/16/18 1921  CK  Once,   STAT     08/16/18 1920   08/16/18 1920  Urine Culture  Once,   STAT    Comments: Please get clean catch specimen ONLY (DO NOT obtain from urinal)    08/16/18 1919   08/16/18 1920  Culture, blood (Routine X 2) w Reflex to ID Panel  BLOOD CULTURE X 2,   R (with STAT occurrences)    Comments: Please obtain prior to antibiotic administration.    08/16/18 1919   08/16/18 1713  Lactic acid, plasma  Now then every 2 hours,   STAT     08/16/18 1713          Vitals/Pain Today's Vitals   08/16/18 1719 08/16/18 1730 08/16/18 1915 08/16/18 1930  BP:  139/87 (!) 158/98 (!) 138/95  Pulse:  (!) 124 (!) 120 (!) 123  Resp:  (!) 21 18 (!) 25  Temp:      TempSrc:      SpO2:  96% 96% 97%  Weight: 76.2 kg     Height: 6\' 1"  (1.854 m)     PainSc: 0-No pain       Isolation Precautions No active isolations  Medications Medications  insulin regular, human (MYXREDLIN) 100 units/ 100 mL infusion (5.4 Units/hr Intravenous New Bag/Given 08/16/18 2010)  dextrose 5 %-0.45 % sodium chloride infusion ( Intravenous Hold 08/16/18 1949)  hydrALAZINE (APRESOLINE) injection 5 mg (has no administration in time range)  dextrose 5 %-0.45 % sodium chloride infusion ( Intravenous Hold 08/16/18 1949)  heparin injection 5,000 Units (has no administration in time range)  0.9 %  sodium chloride infusion (has no administration in time range)   ondansetron (ZOFRAN) injection 4 mg (has no administration in time range)  acetaminophen (TYLENOL) suppository 650 mg (has no administration in time range)  nicotine (NICODERM CQ - dosed in mg/24 hours) patch 21 mg (has no administration in time range)  sodium chloride 0.9 % bolus 1,000 mL (0 mLs Intravenous Stopped 08/16/18 1830)  sodium chloride 0.9 % bolus 2,000 mL (2,000 mLs Intravenous New Bag/Given 08/16/18 2008)  sodium chloride 0.9 % bolus 1,000 mL (1,000 mLs Intravenous New Bag/Given 08/16/18 1948)    Mobility walks

## 2018-08-16 NOTE — ED Notes (Signed)
Report attempted again and was told to call back in 10 minutes.

## 2018-08-16 NOTE — ED Notes (Signed)
Pt found attempting to use urinal w/o assistance and was half way out of bed. RN and this NT repositioned pt in the bed and reminded pt to please call staff if assistance is needed. Pt has call bell in hand.

## 2018-08-16 NOTE — ED Notes (Signed)
Report attempted was told to call back later to give report.

## 2018-08-16 NOTE — H&P (Addendum)
History and Physical    Patrick Moody ZOX:[id number] DOB: 1971-02-08 DOA: 08/16/2018  Referring MD/NP/PA:   PCP: Patient, No Pcp Per   Patient coming from:  The patient is coming from home.  At baseline, pt is independent for most of ADL.        Chief Complaint: AMS  HPI: Patrick Moody is a 48 y.o. male with medical history significant of HTN, tobacco abuse, possible DM, who presents with altered mental status.  Patient has AMS, and cannot provide accurate medical history, therefore, most of the history is obtained by discussing the case with ED physician, per EMS report, and with the nursing staff.  Per EDP, pt sister reported to EDP that pt has been confused in the past 2-3 days. Family reports that the patient works in a hot environment and they feel that he may be dehydrated. Pt has hx of elevated sugars in the past, but is not currently taking any medications for diabetes.  Patient has history of hypertension, but probably does not take medications currently.  Patient does not have active cough, respiratory distress, nausea vomiting, diarrhea noted.  He moves all extremities.  No facial droop or slurred speech. Pt states that he has diffused abdominal discomfort, but no nausea vomiting, diarrhea.  No fever or chills.  ED Course: pt was found to have DKA (with blood sugar>1128, bicarbonate 19, anion gap 24), negative COVID-19 test, alcohol level less than 10, WBC 12.8, lactic acid 4.0, calcium 10.6, AKI with creatinine 2.80, BUN 57, INR 1.1, negative UDS, ABG with pH 7.25, pCO2 37, PO2 77.3.  Temperature normal, tachycardia, tachypnea, oxygen saturation 94-96% on room air, blood pressure 139/87.  Negative chest x-ray, CT head is negative for acute intracranial abnormalities.  Patient is admitted to stepdown as inpatient.  Review of Systems: Could not be reviewed accurately due to altered mental status.  Allergy: No Known Allergies  Past Medical History:  Diagnosis Date  . Hypertension    . Tobacco abuse     History reviewed. No pertinent surgical history.   Social History:  reports that he has been smoking. He has never used smokeless tobacco. No history on file for alcohol and drug.  Family History:  Family History  Problem Relation Age of Onset  . Diabetes Mellitus II Mother   . Hypertension Father     Prior to Admission medications   Medication Sig Start Date End Date Taking? Authorizing Provider  cephALEXin (KEFLEX) 500 MG capsule Take 1 capsule (500 mg total) by mouth 4 (four) times daily. 01/14/16   Thomasene Lot, MD  cyclobenzaprine (FLEXERIL) 10 MG tablet Take 1 tablet (10 mg total) by mouth 2 (two) times daily as needed for muscle spasms. 07/19/17   Teressa Lower, NP  diphenhydrAMINE (BENADRYL) 25 MG tablet Take 50 mg by mouth once.    [provider]  Diphenhydramine-Zinc Acetate (BENADRYL EX) Apply 1 application topically daily as needed (itching/swelling).    [provider]  hydrochlorothiazide (MICROZIDE) 12.5 MG capsule Take 1 capsule (12.5 mg total) by mouth daily. 01/16/16   Thomasene Lot, MD  ibuprofen (ADVIL,MOTRIN) 200 MG tablet Take 400 mg by mouth every 6 (six) hours as needed for headache (pain).    [provider]    Physical Exam: Vitals:   08/16/18 1915 08/16/18 1930 08/16/18 2200 08/17/18 0000  BP: (!) 158/98 (!) 138/95  (!) 158/88  Pulse: (!) 120 (!) 123  (!) 124  Resp: 18 (!) 25  13  Temp:  98.3 F (36.8 C) 97.9 F (36.6 C)  TempSrc:   Oral Oral  SpO2: 96% 97%  99%  Weight:      Height:       General: Not in acute distress.  Dry mucous and membrane HEENT:       Eyes: PERRL, EOMI, no scleral icterus.       ENT: No discharge from the ears and nose, no pharynx injection, no tonsillar enlargement.        Neck: No JVD, no bruit, no mass felt. Heme: No neck lymph node enlargement. Cardiac: S1/S2, RRR, No murmurs, No gallops or rubs. Respiratory:  No rales, wheezing, rhonchi or rubs. GI: Soft,  nondistended, mimimal tenderness diffusely, no rebound pain, no organomegaly, BS present. GU: No hematuria Ext: No pitting leg edema bilaterally. 2+DP/PT pulse bilaterally. Musculoskeletal: No joint deformities, No joint redness or warmth, no limitation of ROM in spin. Skin: No rashes.  Neuro: confused, knows his own name and place [facility] hospital), not oriented to time. Cranial nerves II-XII grossly intact, moves all extremities normally Psych: Patient is not psychotic, no suicidal or hemocidal ideation.  Labs on Admission: I have personally reviewed following labs and imaging studies  CBC: Recent Labs  Lab 08/16/18 1729 08/16/18 1746  WBC 12.8*  --   NEUTROABS 11.1*  --   HGB 16.9 17.7*  HCT 56.7* 52.0  MCV 89.7  --   PLT 463*  --    Basic Metabolic Panel: Recent Labs  Lab 08/16/18 1729 08/16/18 1746 08/16/18 2149  NA 140 140 149*  K 5.2* 5.1 4.8  CL 97* 107 113*  CO2 19*  --  15*  GLUCOSE 1,128* >700* 820*  BUN 60* 57* 58*  CREATININE 3.15* 2.80* 2.68*  CALCIUM 10.6*  --  9.2   GFR: Estimated Creatinine Clearance: 36.3 mL/min (A) (by C-G formula based on SCr of 2.68 mg/dL (H)). Liver Function Tests: Recent Labs  Lab 08/16/18 1729  AST 26  ALT 42  ALKPHOS 146*  BILITOT 1.3*  PROT 9.6*  ALBUMIN 4.7   Recent Labs  Lab 08/16/18 1946  LIPASE 48   No results for input(s): AMMONIA in the last 168 hours. Coagulation Profile: Recent Labs  Lab 08/16/18 1729  INR 1.1   Cardiac Enzymes: Recent Labs  Lab 08/16/18 1947  CKTOTAL 1,269*   BNP (last 3 results) No results for input(s): PROBNP in the last 8760 hours. HbA1C: No results for input(s): HGBA1C in the last 72 hours. CBG: Recent Labs  Lab 08/16/18 2006 08/16/18 2110 08/16/18 2229 08/16/18 2335 08/17/18 0047  GLUCAP >600* >600* >600* >600* 496*   Lipid Profile: No results for input(s): CHOL, HDL, LDLCALC, TRIG, CHOLHDL, LDLDIRECT in the last 72 hours. Thyroid Function Tests: No results for  input(s): TSH, T4TOTAL, FREET4, T3FREE, THYROIDAB in the last 72 hours. Anemia Panel: No results for input(s): VITAMINB12, FOLATE, FERRITIN, TIBC, IRON, RETICCTPCT in the last 72 hours. Urine analysis:    Component Value Date/Time   COLORURINE YELLOW 08/16/2018 1800   APPEARANCEUR CLEAR 08/16/2018 1800   LABSPEC 1.029 08/16/2018 1800   PHURINE 5.0 08/16/2018 1800   GLUCOSEU >=500 (A) 08/16/2018 1800   HGBUR LARGE (A) 08/16/2018 1800   BILIRUBINUR NEGATIVE 08/16/2018 1800   KETONESUR 5 (A) 08/16/2018 1800   PROTEINUR 100 (A) 08/16/2018 1800   NITRITE NEGATIVE 08/16/2018 1800   LEUKOCYTESUR NEGATIVE 08/16/2018 1800   Sepsis Labs: @LABRCNTIP (procalcitonin:4,lacticidven:4) ) Recent Results (from the past 240 hour(s))  SARS Coronavirus 2 (CEPHEID -  Performed in [facility] hospital lab), Hosp Order     Status: None   Collection Time: 08/16/18  6:03 PM   Specimen: Nasopharyngeal Swab  Result Value Ref Range Status   SARS Coronavirus 2 NEGATIVE NEGATIVE Final    Comment: (NOTE) If result is NEGATIVE SARS-CoV-2 target nucleic acids are NOT DETECTED. The SARS-CoV-2 RNA is generally detectable in upper and lower  respiratory specimens during the acute phase of infection. The lowest  concentration of SARS-CoV-2 viral copies this assay can detect is 250  copies / mL. A negative result does not preclude SARS-CoV-2 infection  and should not be used as the sole basis for treatment or other  patient management decisions.  A negative result may occur with  improper specimen collection / handling, submission of specimen other  than nasopharyngeal swab, presence of viral mutation(s) within the  areas targeted by this assay, and inadequate number of viral copies  (<250 copies / mL). A negative result must be combined with clinical  observations, patient history, and epidemiological information. If result is POSITIVE SARS-CoV-2 target nucleic acids are DETECTED. The SARS-CoV-2 RNA is generally  detectable in upper and lower  respiratory specimens dur ing the acute phase of infection.  Positive  results are indicative of active infection with SARS-CoV-2.  Clinical  correlation with patient history and other diagnostic information is  necessary to determine patient infection status.  Positive results do  not rule out bacterial infection or co-infection with other viruses. If result is PRESUMPTIVE POSTIVE SARS-CoV-2 nucleic acids MAY BE PRESENT.   A presumptive positive result was obtained on the submitted specimen  and confirmed on repeat testing.  While 2019 novel coronavirus  (SARS-CoV-2) nucleic acids may be present in the submitted sample  additional confirmatory testing may be necessary for epidemiological  and / or clinical management purposes  to differentiate between  SARS-CoV-2 and other Sarbecovirus currently known to infect humans.  If clinically indicated additional testing with an alternate test  methodology [phone]([id number]) is advised. The SARS-CoV-2 RNA is generally  detectable in upper and lower respiratory sp ecimens during the acute  phase of infection. The expected result is Negative. Fact Sheet for Patients:  BoilerBrush.com.cyhttps://www.fda.gov/media/136312/download Fact Sheet for Healthcare Providers: https://pope.com/https://www.fda.gov/media/136313/download This test is not yet approved or cleared by the [place] FDA and has been authorized for detection and/or diagnosis of SARS-CoV-2 by FDA under an Emergency Use Authorization (EUA).  This EUA will remain in effect (meaning this test can be used) for the duration of the COVID-19 declaration under Section 564(b)(1) of the Act, 21 U.S.C. section 360bbb-3(b)(1), unless the authorization is terminated or revoked sooner. Performed at [facility], [address] [place], [place] [address]   MRSA PCR Screening     Status: None   Collection Time: 08/16/18  9:06 PM   Specimen: Nasal Mucosa; Nasopharyngeal  Result Value  Ref Range Status   MRSA by PCR NEGATIVE NEGATIVE Final    Comment:        The GeneXpert MRSA Assay (FDA approved for NASAL specimens only), is one component of a comprehensive MRSA colonization surveillance program. It is not intended to diagnose MRSA infection nor to guide or monitor treatment for MRSA infections. Performed at [facility], [address] [place], [place] [address]      Radiological Exams on Admission: Ct Head Wo Contrast  Result Date: 08/16/2018 CLINICAL DATA:  Drowsiness and disorientation. EXAM: CT HEAD WITHOUT CONTRAST TECHNIQUE: Contiguous axial images were obtained  from the base of the skull through the vertex without intravenous contrast. COMPARISON:  None. FINDINGS: Brain: No evidence of acute infarction, hemorrhage, hydrocephalus, extra-axial collection or mass lesion/mass effect. Vascular: No hyperdense vessel or unexpected calcification. Skull: Normal. Negative for fracture or focal lesion. Sinuses/Orbits: No acute finding. Other: None. IMPRESSION: No acute intracranial abnormality. Electronically Signed   By: Ted Mcalpineobrinka  Dimitrova M.D.   On: 08/16/2018 18:51   Dg Chest Port 1 View  Result Date: 08/16/2018 CLINICAL DATA:  Dyspnea EXAM: PORTABLE CHEST 1 VIEW COMPARISON:  None. FINDINGS: The heart size and mediastinal contours are within normal limits. Both lungs are clear. The visualized skeletal structures are unremarkable. IMPRESSION: No active disease. Electronically Signed   By: Alcide CleverMark  Lukens M.D.   On: 08/16/2018 17:56     EKG: Independently reviewed.  Sinus rhythm, tachycardia, QTC 448, LAE.  Assessment/Plan Principal Problem:   DKA, type 1 (HCC) Active Problems:   Essential hypertension, benign   Dehydration   Hypercalcemia   AKI (acute kidney injury) (HCC)   Acute metabolic encephalopathy   Lactic acidosis   Leukocytosis   Tobacco abuse   Newly diagnosed diabetes (HCC)   Rhabdomyolysis   DKA, type 1 and newly diagnosed  diabetes: pt has hx of elevated blood sugar, but not taking any medications for diabetes.  Patient may have undiagnosed diabetes in the past.  Now presents with DKA with blood sugar>1128, bicarbonate 19, anion gap 24.  - Admit to stepdown as inpt - 3L of NS bolus - start DKA protocol with BMP q4h - IVF: NS 125 cc/h; will switch to D5-1/2NS when CBG<250 - replete K as needed - Zofran prn nausea  - NPO  - consult to diabetic educator and case manager - check A1c and FLP   Acute metabolic encephalopathy: likely due to multifactorial etiology, including DKA, hyperosmolar hyperglycemia status, AKI, dehydration.  CT head is negative for acute intracranial abnormalities.  Patient moves all extremities.  No focal signs for stroke. -Frequent neuro check  Leukocytosis: WBC 12.8. No fever. Likely due to DKA. -will follow up blood and urine culture, UA  Lactic acid acidosis: Possibly due to dehydration. No fever, dose not seem to have sepsis. -will get Procalcitonin and trend lactic acid level -IVF:  3L NS bolus, then 125 cc/h -follow up by CBC  Essential hypertension, benign: Blood pressure 139/87. -prn hydralazine IV  Dehydration:  -IVF as above  Mild Hypercalcemia: Ca 10.6.  Most likely likely due to dehydration. -IV fluid as above  AKI (acute kidney injury) [facility](HCC): Cre 3.15 and BUN 57, most likely due to prerenal failure secondary to dehydration. -IV fluid as above -Follow-up by BMP -hold bupropion and HCTZ (patient does not seem to take these 2 medications currently). -check CK  Addendum: CK level elevated at 1269, indicating rhabdomyolysis -IV fluid as above -Repeat CK in morning  Tobacco abuse: -nicotine patch   Inpatient status:  # Patient requires inpatient status due to high intensity of service, high risk for further deterioration and high frequency of surveillance required.  I certify that at the point of admission it is my clinical judgment that the patient will  require inpatient hospital care spanning beyond 2 midnights from the point of admission.  . This patient has chronic comorbidities including HTN, Tobacco abuse and possible undiagnosed diabetes . Now patient has presenting with altered mental status and DKA, rhabdomyolysis . The worrisome physical exam findings include altered mental status, dry mucous membrane . The initial radiographic and laboratory data  are worrisome because of DKA (blood sugar>1128, bicarbonate 19, anion gap 24).  Leukocytosis, AKI, hypercalcemia. . Current medical needs: please see my assessment and plan . Predictability of an adverse outcome (risk): Hypertension, tobacco abuse, possible undiagnosed diabetes, now presents with newly diagnosed diabetes with DKA, blood sugar is >1128, with acute metabolic encephalopathy.  Patient also has AKI, dehydration, hypercalcemia, leukocytosis and rhabdomyolysis. His presentation is highly complicated.  Patient is at high risk of deteriorating.  Will need to be treated in hospital for at least 2 days.   DVT ppx: SQ Heparin       Code Status: Full code Family Communication: None at bed side.      Disposition Plan:  Anticipate discharge back to previous home environment Consults called:  none Admission status:  SDU/inpation       Date of Service 08/17/2018    Lorretta HarpXilin Jashiya Bassett [facility]   If 7PM-7AM, please contact night-coverage [email or web address] Password [email or web address] 08/17/2018, 1:21 AM

## 2018-08-16 NOTE — ED Notes (Signed)
Date and time results received: 08/16/18 6:16 PM   Test: lactic acid Critical Value: 4.0  Name of Provider Notified: Messick  Orders Received? Or Actions Taken?: acknowledges order  Charted by Madaline Savage RN

## 2018-08-16 NOTE — Progress Notes (Signed)
CRITICAL VALUE ALERT  Critical Value:  Lactic Acid 2.8 and Glucose 820  Date & Time Notied:  08/16/2018 2300  Provider Notified: Tylene Fantasia  Orders Received/Actions taken: awaiting orders

## 2018-08-16 NOTE — ED Provider Notes (Signed)
[facility]-EMERGENCY DEPT Provider Note   CSN: [id number] Arrival date & time: 08/16/18  1645     History   Chief Complaint Chief Complaint  Patient presents with  . Altered Mental Status    HPI Patrick Moody is a 48 y.o. male.     48 year old male with prior medical history as detailed below presents for evaluation of possible dehydration.  Majority of patient's history is obtained from his sister.  Patient apparently with mildly increasing confusion over the last 2 to 3 days.  Family reports that the patient works in a hot environment and they feel that he may be dehydrated.  Patient has apparently had elevated sugars in the past but is not currently taking any medications for diabetes.  He reportedly has a history of hypertension but apparently does not take medications for that either.  Upon my evaluation the patient is pleasantly confused.  It is hard to get him to answer specific questions.  He does not endorse pain or difficulty breathing.  The history is provided by the patient, medical records and a relative.  Altered Mental Status Presenting symptoms: disorientation   Severity:  Mild Most recent episode:  2 days ago Episode history:  Single Duration:  3 days Timing:  Constant Progression:  Worsening Chronicity:  New   Past Medical History:  Diagnosis Date  . Hypertension     Patient Active Problem List   Diagnosis Date Noted  . Essential hypertension, benign 01/16/2016  . Cellulitis 01/13/2016    History reviewed. No pertinent surgical history.      Home Medications    Prior to Admission medications   Medication Sig Start Date End Date Taking? Authorizing Provider  cephALEXin (KEFLEX) 500 MG capsule Take 1 capsule (500 mg total) by mouth 4 (four) times daily. 01/14/16   Thomasene Lotaylor, James, MD  cyclobenzaprine (FLEXERIL) 10 MG tablet Take 1 tablet (10 mg total) by mouth 2 (two) times daily as needed for muscle spasms. 07/19/17   Teressa LowerPickering,  Vrinda, NP  diphenhydrAMINE (BENADRYL) 25 MG tablet Take 50 mg by mouth once.    [provider]  Diphenhydramine-Zinc Acetate (BENADRYL EX) Apply 1 application topically daily as needed (itching/swelling).    [provider]  hydrochlorothiazide (MICROZIDE) 12.5 MG capsule Take 1 capsule (12.5 mg total) by mouth daily. 01/16/16   Thomasene Lotaylor, James, MD  ibuprofen (ADVIL,MOTRIN) 200 MG tablet Take 400 mg by mouth every 6 (six) hours as needed for headache (pain).    [provider]    Family History No family history on file.  Social History Social History   Tobacco Use  . Smoking status: Current Every Day Smoker  . Smokeless tobacco: Never Used  Substance Use Topics  . Alcohol use: Not on file  . Drug use: Not on file     Allergies   Patient has no known allergies.   Review of Systems Review of Systems  All other systems reviewed and are negative.    Physical Exam Updated Vital Signs BP 139/87   Pulse (!) 124   Temp 97.8 F (36.6 C) (Oral)   Resp (!) 21   Ht 6\' 1"  (1.854 m)   Wt 76.2 kg   SpO2 96%   BMI 22.16 kg/m   Physical Exam Vitals signs and nursing note reviewed.  Constitutional:      General: He is not in acute distress.    Appearance: He is well-developed.  HENT:     Head: Normocephalic and atraumatic.  Eyes:     Conjunctiva/sclera: Conjunctivae normal.     Pupils: Pupils are equal, round, and reactive to light.  Neck:     Musculoskeletal: Normal range of motion and neck supple.  Cardiovascular:     Rate and Rhythm: Regular rhythm. Tachycardia present.     Heart sounds: Normal heart sounds.  Pulmonary:     Effort: Pulmonary effort is normal. No respiratory distress.     Breath sounds: Normal breath sounds.  Abdominal:     General: There is no distension.     Palpations: Abdomen is soft.     Tenderness: There is no abdominal tenderness.  Musculoskeletal: Normal range of motion.        General: No deformity.  Skin:     General: Skin is warm and dry.  Neurological:     Mental Status: He is alert and oriented to person, place, and time.      ED Treatments / Results  Labs (all labs ordered are listed, but only abnormal results are displayed) Labs Reviewed  COMPREHENSIVE METABOLIC PANEL - Abnormal; Notable for the following components:      Result Value   Potassium 5.2 (*)    Chloride 97 (*)    CO2 19 (*)    Glucose, Bld 1,128 (*)    BUN 60 (*)    Creatinine, Ser 3.15 (*)    Calcium 10.6 (*)    Total Protein 9.6 (*)    Alkaline Phosphatase 146 (*)    Total Bilirubin 1.3 (*)    GFR calc non Af Amer 22 (*)    GFR calc Af Amer 26 (*)    Anion gap 24 (*)    All other components within normal limits  CBC WITH DIFFERENTIAL/PLATELET - Abnormal; Notable for the following components:   WBC 12.8 (*)    RBC 6.32 (*)    HCT 56.7 (*)    MCHC 29.8 (*)    Platelets 463 (*)    Neutro Abs 11.1 (*)    All other components within normal limits  LACTIC ACID, PLASMA - Abnormal; Notable for the following components:   Lactic Acid, Venous 4.0 (*)    All other components within normal limits  BLOOD GAS, ARTERIAL - Abnormal; Notable for the following components:   pH, Arterial 7.250 (*)    pO2, Arterial 77.3 (*)    Bicarbonate 16.0 (*)    Acid-base deficit 10.4 (*)    All other components within normal limits  CBG MONITORING, ED - Abnormal; Notable for the following components:   Glucose-Capillary >600 (*)    All other components within normal limits  I-STAT CHEM 8, ED - Abnormal; Notable for the following components:   BUN 57 (*)    Creatinine, Ser 2.80 (*)    Glucose, Bld >700 (*)    TCO2 21 (*)    Hemoglobin 17.7 (*)    All other components within normal limits  SARS CORONAVIRUS 2 (HOSPITAL ORDER, PERFORMED IN [facility])  URINE CULTURE  CULTURE, BLOOD (ROUTINE X 2)  CULTURE, BLOOD (ROUTINE X 2)  PROTIME-INR  RAPID URINE DRUG SCREEN, HOSP PERFORMED  ETHANOL  LACTIC ACID, PLASMA   URINALYSIS, ROUTINE W REFLEX MICROSCOPIC  CK  HIV ANTIBODY (ROUTINE TESTING W REFLEX)    EKG EKG Interpretation  Date/Time:  Monday August 16 2018 16:57:22 EDT Ventricular Rate:  134 PR Interval:  130 QRS Duration: 82 QT Interval:  300 QTC Calculation: 448 R Axis:   79 Text Interpretation:  Sinus tachycardia Right atrial enlargement Borderline ECG Confirmed by Dene Gentry [phone]) on 08/16/2018 5:12:11 PM   Radiology Ct Head Wo Contrast  Result Date: 08/16/2018 CLINICAL DATA:  Drowsiness and disorientation. EXAM: CT HEAD WITHOUT CONTRAST TECHNIQUE: Contiguous axial images were obtained from the base of the skull through the vertex without intravenous contrast. COMPARISON:  None. FINDINGS: Brain: No evidence of acute infarction, hemorrhage, hydrocephalus, extra-axial collection or mass lesion/mass effect. Vascular: No hyperdense vessel or unexpected calcification. Skull: Normal. Negative for fracture or focal lesion. Sinuses/Orbits: No acute finding. Other: None. IMPRESSION: No acute intracranial abnormality. Electronically Signed   By: Fidela Salisbury M.D.   On: 08/16/2018 18:51   Dg Chest Port 1 View  Result Date: 08/16/2018 CLINICAL DATA:  Dyspnea EXAM: PORTABLE CHEST 1 VIEW COMPARISON:  None. FINDINGS: The heart size and mediastinal contours are within normal limits. Both lungs are clear. The visualized skeletal structures are unremarkable. IMPRESSION: No active disease. Electronically Signed   By: Inez Catalina M.D.   On: 08/16/2018 17:56    Procedures Procedures (including critical care time) CRITICAL CARE Performed by: Valarie Merino   Total critical care time: 45 minutes  Critical care time was exclusive of separately billable procedures and treating other patients.  Critical care was necessary to treat or prevent imminent or life-threatening deterioration.  Critical care was time spent personally by me on the following activities: development of treatment plan with  patient and/or surrogate as well as nursing, discussions with consultants, evaluation of patient's response to treatment, examination of patient, obtaining history from patient or surrogate, ordering and performing treatments and interventions, ordering and review of laboratory studies, ordering and review of radiographic studies, pulse oximetry and re-evaluation of patient's condition.   Medications Ordered in ED Medications  insulin regular, human (MYXREDLIN) 100 units/ 100 mL infusion (has no administration in time range)  dextrose 5 %-0.45 % sodium chloride infusion (has no administration in time range)  sodium chloride 0.9 % bolus 1,000 mL (1,000 mLs Intravenous New Bag/Given 08/16/18 1732)     Initial Impression / Assessment and Plan / ED Course  I have reviewed the triage vital signs and the nursing notes.  Pertinent labs & imaging results that were available during my care of the patient were reviewed by me and considered in my medical decision making (see chart for details).        MDM  Screen complete  Cesareo Vickrey was evaluated in Emergency Department on 08/16/2018 for the symptoms described in the history of present illness. He was evaluated in the context of the global COVID-19 pandemic, which necessitated consideration that the patient might be at risk for infection with the SARS-CoV-2 virus that causes COVID-19. Institutional protocols and algorithms that pertain to the evaluation of patients at risk for COVID-19 are in a state of rapid change based on information released by regulatory bodies including the CDC and federal and state organizations. These policies and algorithms were followed during the patient's care in the ED.  Patient is presenting for evaluation of apparent confusion and possible dehydration.  Patient is noted to be significantly hyperglycemic.  Patient does report prior history of diabetes.  He does not currently take any medications for same.  Further  lab results suggest significant hyperglycemia and metabolic derangement with acute kidney injury.  Patient would benefit from admission.  Medicine service Blaine Hamper) is aware of case and will evaluate for admission.  Final Clinical Impressions(s) / ED Diagnoses   Final diagnoses:  Hyperglycemia  ED Discharge Orders    None       Wynetta FinesMessick, [place] C, MD 08/16/18 Serena [id number]

## 2018-08-16 NOTE — ED Triage Notes (Signed)
Pt dropped off by family. Pt appears drowsy. Alert to self, place, and situation. Disoriented to time; verbalizing it's Sunday of 1990. Pt CBG reading high; no known hx of diabetes.

## 2018-08-17 ENCOUNTER — Encounter (HOSPITAL_COMMUNITY): Payer: Self-pay

## 2018-08-17 DIAGNOSIS — M6282 Rhabdomyolysis: Secondary | ICD-10-CM | POA: Diagnosis present

## 2018-08-17 DIAGNOSIS — E1010 Type 1 diabetes mellitus with ketoacidosis without coma: Secondary | ICD-10-CM

## 2018-08-17 LAB — LIPID PANEL
Cholesterol: 533 mg/dL — ABNORMAL HIGH (ref 0–200)
HDL: 54 mg/dL (ref >40)
LDL Cholesterol: UNDETERMINED mg/dL (ref 0–99)
Total CHOL/HDL Ratio: 9.9 RATIO
Triglycerides: 796 mg/dL — ABNORMAL HIGH (ref <150)
VLDL: UNDETERMINED mg/dL (ref 0–40)

## 2018-08-17 LAB — BASIC METABOLIC PANEL
Anion gap: 11 (ref 5–15)
Anion gap: 10 (ref 5–15)
Anion gap: 6 (ref 5–15)
Anion gap: 14 (ref 5–15)
BUN: 50 mg/dL — ABNORMAL HIGH (ref 6–20)
BUN: 41 mg/dL — ABNORMAL HIGH (ref 6–20)
BUN: 37 mg/dL — ABNORMAL HIGH (ref 6–20)
BUN: 34 mg/dL — ABNORMAL HIGH (ref 6–20)
CO2: 20 mmol/L — ABNORMAL LOW (ref 22–32)
CO2: 21 mmol/L — ABNORMAL LOW (ref 22–32)
CO2: 23 mmol/L (ref 22–32)
CO2: 21 mmol/L — ABNORMAL LOW (ref 22–32)
Calcium: 9.3 mg/dL (ref 8.9–10.3)
Calcium: 9.3 mg/dL (ref 8.9–10.3)
Calcium: 9 mg/dL (ref 8.9–10.3)
Calcium: 8.9 mg/dL (ref 8.9–10.3)
Chloride: 124 mmol/L — ABNORMAL HIGH (ref 98–111)
Chloride: 130 mmol/L — ABNORMAL HIGH (ref 98–111)
Chloride: 129 mmol/L — ABNORMAL HIGH (ref 98–111)
Chloride: 123 mmol/L — ABNORMAL HIGH (ref 98–111)
Creatinine, Ser: 2.05 mg/dL — ABNORMAL HIGH (ref 0.61–1.24)
Creatinine, Ser: 1.72 mg/dL — ABNORMAL HIGH (ref 0.61–1.24)
Creatinine, Ser: 1.84 mg/dL — ABNORMAL HIGH (ref 0.61–1.24)
Creatinine, Ser: 1.63 mg/dL — ABNORMAL HIGH (ref 0.61–1.24)
GFR calc Af Amer: 43 mL/min — ABNORMAL LOW (ref >60)
GFR calc Af Amer: 53 mL/min — ABNORMAL LOW (ref >60)
GFR calc Af Amer: 49 mL/min — ABNORMAL LOW (ref >60)
GFR calc Af Amer: 57 mL/min — ABNORMAL LOW (ref >60)
GFR calc non Af Amer: 37 mL/min — ABNORMAL LOW (ref >60)
GFR calc non Af Amer: 46 mL/min — ABNORMAL LOW (ref >60)
GFR calc non Af Amer: 42 mL/min — ABNORMAL LOW (ref >60)
GFR calc non Af Amer: 49 mL/min — ABNORMAL LOW (ref >60)
Glucose, Bld: 405 mg/dL — ABNORMAL HIGH (ref 70–99)
Glucose, Bld: 138 mg/dL — ABNORMAL HIGH (ref 70–99)
Glucose, Bld: 177 mg/dL — ABNORMAL HIGH (ref 70–99)
Glucose, Bld: 266 mg/dL — ABNORMAL HIGH (ref 70–99)
Potassium: 4.6 mmol/L (ref 3.5–5.1)
Potassium: 3.9 mmol/L (ref 3.5–5.1)
Potassium: 3.4 mmol/L — ABNORMAL LOW (ref 3.5–5.1)
Potassium: 4.7 mmol/L (ref 3.5–5.1)
Sodium: 155 mmol/L — ABNORMAL HIGH (ref 135–145)
Sodium: 161 mmol/L (ref 135–145)
Sodium: 158 mmol/L — ABNORMAL HIGH (ref 135–145)
Sodium: 158 mmol/L — ABNORMAL HIGH (ref 135–145)

## 2018-08-17 LAB — CK: Total CK: 2226 U/L — ABNORMAL HIGH (ref 49–397)

## 2018-08-17 LAB — GLUCOSE, CAPILLARY
Glucose-Capillary: 124 mg/dL — ABNORMAL HIGH (ref 70–99)
Glucose-Capillary: 127 mg/dL — ABNORMAL HIGH (ref 70–99)
Glucose-Capillary: 129 mg/dL — ABNORMAL HIGH (ref 70–99)
Glucose-Capillary: 140 mg/dL — ABNORMAL HIGH (ref 70–99)
Glucose-Capillary: 172 mg/dL — ABNORMAL HIGH (ref 70–99)
Glucose-Capillary: 173 mg/dL — ABNORMAL HIGH (ref 70–99)
Glucose-Capillary: 183 mg/dL — ABNORMAL HIGH (ref 70–99)
Glucose-Capillary: 184 mg/dL — ABNORMAL HIGH (ref 70–99)
Glucose-Capillary: 190 mg/dL — ABNORMAL HIGH (ref 70–99)
Glucose-Capillary: 211 mg/dL — ABNORMAL HIGH (ref 70–99)
Glucose-Capillary: 317 mg/dL — ABNORMAL HIGH (ref 70–99)
Glucose-Capillary: 319 mg/dL — ABNORMAL HIGH (ref 70–99)
Glucose-Capillary: 340 mg/dL — ABNORMAL HIGH (ref 70–99)
Glucose-Capillary: 380 mg/dL — ABNORMAL HIGH (ref 70–99)
Glucose-Capillary: 423 mg/dL — ABNORMAL HIGH (ref 70–99)
Glucose-Capillary: 496 mg/dL — ABNORMAL HIGH (ref 70–99)

## 2018-08-17 LAB — URINE CULTURE: Culture: NO GROWTH

## 2018-08-17 LAB — HIV ANTIBODY (ROUTINE TESTING W REFLEX): HIV Screen 4th Generation wRfx: NONREACTIVE

## 2018-08-17 LAB — MAGNESIUM: Magnesium: 2.8 mg/dL — ABNORMAL HIGH (ref 1.7–2.4)

## 2018-08-17 LAB — LDL CHOLESTEROL, DIRECT: Direct LDL: 274.5 mg/dL — ABNORMAL HIGH (ref 0–99)

## 2018-08-17 MED ORDER — INSULIN GLARGINE 100 UNIT/ML ~~LOC~~ SOLN
5.0000 [IU] | Freq: Once | SUBCUTANEOUS | Status: AC
  Administered 2018-08-17: 5 [IU] via SUBCUTANEOUS
  Filled 2018-08-17: qty 0.05

## 2018-08-17 MED ORDER — CHLORHEXIDINE GLUCONATE CLOTH 2 % EX PADS
6.0000 | MEDICATED_PAD | Freq: Every day | CUTANEOUS | Status: DC
  Administered 2018-08-17 – 2018-08-18 (×2): 6 via TOPICAL

## 2018-08-17 MED ORDER — INSULIN STARTER KIT- PEN NEEDLES (ENGLISH)
1.0000 | Freq: Once | Status: AC
  Administered 2018-08-18: 1
  Filled 2018-08-17: qty 1

## 2018-08-17 MED ORDER — INSULIN ASPART 100 UNIT/ML ~~LOC~~ SOLN: 8.0000 [IU] | Freq: Three times a day (TID) | SUBCUTANEOUS | Status: DC

## 2018-08-17 MED ORDER — AMLODIPINE BESYLATE 5 MG PO TABS
5.0000 mg | ORAL_TABLET | Freq: Every day | ORAL | Status: DC
  Administered 2018-08-17: 5 mg via ORAL
  Filled 2018-08-17: qty 1

## 2018-08-17 MED ORDER — SODIUM CHLORIDE 0.45 % IV SOLN
INTRAVENOUS | Status: DC
  Administered 2018-08-17 – 2018-08-18 (×2): via INTRAVENOUS

## 2018-08-17 MED ORDER — LIVING WELL WITH DIABETES BOOK
Freq: Once | Status: AC
  Administered 2018-08-18: 09:00:00
  Filled 2018-08-17: qty 1

## 2018-08-17 MED ORDER — INSULIN GLARGINE 100 UNIT/ML ~~LOC~~ SOLN: 15.0000 [IU] | Freq: Every day | SUBCUTANEOUS | Status: DC

## 2018-08-17 MED ORDER — ATORVASTATIN CALCIUM 40 MG PO TABS
40.0000 mg | ORAL_TABLET | Freq: Every day | ORAL | Status: DC
  Administered 2018-08-17 – 2018-08-18 (×2): 40 mg via ORAL
  Filled 2018-08-17 (×2): qty 1

## 2018-08-17 MED ORDER — POTASSIUM CHLORIDE CRYS ER 20 MEQ PO TBCR
40.0000 meq | EXTENDED_RELEASE_TABLET | Freq: Once | ORAL | Status: AC
  Administered 2018-08-17: 40 meq via ORAL
  Filled 2018-08-17: qty 2

## 2018-08-17 MED ORDER — INSULIN ASPART 100 UNIT/ML ~~LOC~~ SOLN
0.0000 [IU] | Freq: Three times a day (TID) | SUBCUTANEOUS | Status: DC
  Administered 2018-08-17: 2 [IU] via SUBCUTANEOUS
  Administered 2018-08-18 (×2): 9 [IU] via SUBCUTANEOUS
  Administered 2018-08-18: 7 [IU] via SUBCUTANEOUS
  Administered 2018-08-19: 5 [IU] via SUBCUTANEOUS

## 2018-08-17 MED ORDER — INSULIN GLARGINE 100 UNIT/ML ~~LOC~~ SOLN
10.0000 [IU] | Freq: Every day | SUBCUTANEOUS | Status: DC
  Administered 2018-08-17: 10 [IU] via SUBCUTANEOUS
  Filled 2018-08-17: qty 0.1

## 2018-08-17 MED ORDER — LABETALOL HCL 5 MG/ML IV SOLN
10.0000 mg | Freq: Four times a day (QID) | INTRAVENOUS | Status: DC | PRN
  Administered 2018-08-17 – 2018-08-18 (×2): 10 mg via INTRAVENOUS
  Filled 2018-08-17 (×3): qty 4

## 2018-08-17 MED ORDER — INSULIN ASPART 100 UNIT/ML ~~LOC~~ SOLN
0.0000 [IU] | Freq: Every day | SUBCUTANEOUS | Status: DC
  Administered 2018-08-17: 4 [IU] via SUBCUTANEOUS

## 2018-08-17 MED ORDER — INSULIN ASPART 100 UNIT/ML ~~LOC~~ SOLN: 5.0000 [IU] | Freq: Three times a day (TID) | SUBCUTANEOUS | Status: DC

## 2018-08-17 NOTE — Progress Notes (Signed)
PROGRESS NOTE  Patrick Moody QMG:[id number] DOB: 07-08-1970 DOA: 08/16/2018 PCP: Patient, No Pcp Per  HPI/Recap of past 24 hours: Patrick Moody is a 48 y.o. male with medical history significant of HTN, tobacco abuse, possible DM, who presents with altered mental status.  Patient has AMS, and cannot provide accurate medical history, therefore, most of the history is obtained by discussing the case with ED physician, per EMS report, and with the nursing staff.  Per EDP, pt sister reported to EDP that pt has been confused in the past 2-3 days. Family reports that the patient works in a hot environment and they feel that he may be dehydrated. Pt has hx of elevated sugars in the past, but is not currently taking any medications for diabetes. Patient has history of hypertension, but probably does not take medications currently.  Patient does not have active cough, respiratory distress, nausea vomiting, diarrhea noted.  He moves all extremities.  No facial droop or slurred speech. Pt states that he has diffused abdominal discomfort, but no nausea vomiting, diarrhea.  No fever or chills.  ED Course: pt was found to have DKA (with blood sugar>1128, bicarbonate 19, anion gap 24), negative COVID-19 test, alcohol level less than 10, WBC 12.8, lactic acid 4.0, calcium 10.6, AKI with creatinine 2.80, BUN 57, INR 1.1, negative UDS, ABG with pH 7.25, pCO2 37, PO2 77.3.  Temperature normal, tachycardia, tachypnea, oxygen saturation 94-96% on room air, blood pressure 139/87.  Negative chest x-ray, CT head is negative for acute intracranial abnormalities.  Patient is admitted to stepdown as inpatient.  08/17/18: Patient seen and examined at his bedside.  He is alert and oriented x3 however he is slow in responding to questions.  States he had no prior treatment for diabetes.  Admits to mild diffuse abdominal pain on palpation.  Assessment/Plan: Principal Problem:   DKA, type 1 (HCC) Active Problems:   Essential  hypertension, benign   Dehydration   Hypercalcemia   AKI (acute kidney injury) ([place])   Acute metabolic encephalopathy   Lactic acidosis   Leukocytosis   Tobacco abuse   Newly diagnosed diabetes ([place])   Rhabdomyolysis  Newly diagnosed diabetes complicated by DKA, unspecified type Presented with blood sugar greater than 1120, bicarbonate 19 and anion gap of 24 Phase 1 DKA protocol Awaiting serial BMP Switch to D5 half-normal saline when CBG less than 250 Potassium as needed Diabetes coordinator has been consulted Hemoglobin A1c pending  AKI, unclear of baseline No prior history to compare Presented with creatinine of 3.5 Creatinine on 08/17/2018 was 2.05 with GFR of 43 Continue to avoid nephrotoxins Continue IV fluid hydration Continue to monitor urine output 1125 cc urine output recorded in the last 24 hours Repeat BMP  Hyperlipidemia LDL unable to calculate 33 Start Lipitor 40 daily  Rhabdomyolysis, unclear etiology CPK greater than 2200 from greater than 1200 yesterday Continue IV fluid hydration Continue to monitor renal function UDS negative  Acute metabolic encephalopathy likely secondary to DKA with severe dehydration CT head unremarkable for any acute intracranial findings. Alert and oriented x3 however slow to responding to questions  Dehydration in the setting of DKA Continue IV fluid hydration Continue to monitor urine output  Hypertension Blood pressure is stable On HCTZ at home 12.5 mg daily which is been held due to AKI  Hypovolemic hyponatremia Sodium 155 from 149 yesterday Continue IV fluid hydration with D5 half-normal saline   DVT ppx: SQ Heparin       Code Status: Full code  Family Communication: None at bed side.      Disposition Plan:   Possible discharge to home in 1 to 2 days when hyperglycemia is controlled on insulin regimen. Consults called:  none     Objective: Vitals:   08/17/18 0100 08/17/18 0200 08/17/18 0300 08/17/18 0400   BP: (!) 152/76 134/87 (!) 156/87 138/89  Pulse: (!) 115     Resp: 15 13 14    Temp:    97.8 F (36.6 C)  TempSrc:    Oral  SpO2: 98%     Weight:      Height:        Intake/Output Summary (Last 24 hours) at 08/17/2018 0745 Last data filed at 08/17/2018 0400 Gross per 24 hour  Intake 2981.36 ml  Output 1125 ml  Net 1856.36 ml   Filed Weights   08/16/18 1719  Weight: 76.2 kg    Exam:  . General: 48 y.o. year-old male well developed well nourished in no acute distress.  Alert and oriented x3. . Cardiovascular: Regular rate and rhythm with no rubs or gallops.  No thyromegaly or JVD noted.   Marland Kitchen. Respiratory: Clear to auscultation with no wheezes or rales. Good inspiratory effort. . Abdomen: Soft mild tenderness on palpation diffusely nondistended with normal bowel sounds x4 quadrants. . Musculoskeletal: No lower extremity edema. 2/4 pulses in all 4 extremities. Marland Kitchen. Psychiatry: Mood is appropriate for condition and setting   Data Reviewed: CBC: Recent Labs  Lab 08/16/18 1729 08/16/18 1746  WBC 12.8*  --   NEUTROABS 11.1*  --   HGB 16.9 17.7*  HCT 56.7* 52.0  MCV 89.7  --   PLT 463*  --    Basic Metabolic Panel: Recent Labs  Lab 08/16/18 1729 08/16/18 1746 08/16/18 2149 08/17/18 0217  NA 140 140 149* 155*  K 5.2* 5.1 4.8 4.6  CL 97* 107 113* 124*  CO2 19*  --  15* 20*  GLUCOSE 1,128* >700* 820* 405*  BUN 60* 57* 58* 50*  CREATININE 3.15* 2.80* 2.68* 2.05*  CALCIUM 10.6*  --  9.2 9.3   GFR: Estimated Creatinine Clearance: 47.5 mL/min (A) (by C-G formula based on SCr of 2.05 mg/dL (H)). Liver Function Tests: Recent Labs  Lab 08/16/18 1729  AST 26  ALT 42  ALKPHOS 146*  BILITOT 1.3*  PROT 9.6*  ALBUMIN 4.7   Recent Labs  Lab 08/16/18 1946  LIPASE 48   No results for input(s): AMMONIA in the last 168 hours. Coagulation Profile: Recent Labs  Lab 08/16/18 1729  INR 1.1   Cardiac Enzymes: Recent Labs  Lab 08/16/18 1947 08/17/18 0217  CKTOTAL  1,269* 2,226*   BNP (last 3 results) No results for input(s): PROBNP in the last 8760 hours. HbA1C: No results for input(s): HGBA1C in the last 72 hours. CBG: Recent Labs  Lab 08/17/18 0304 08/17/18 0410 08/17/18 0512 08/17/18 0620 08/17/18 0719  GLUCAP 317* 380* 423* 211* 140*   Lipid Profile: Recent Labs    08/17/18 0217  CHOL 533*  HDL 54  LDLCALC UNABLE TO CALCULATE IF TRIGLYCERIDE OVER 400 mg/dL  TRIG [id number]*  CHOLHDL 9.9   Thyroid Function Tests: No results for input(s): TSH, T4TOTAL, FREET4, T3FREE, THYROIDAB in the last 72 hours. Anemia Panel: No results for input(s): VITAMINB12, FOLATE, FERRITIN, TIBC, IRON, RETICCTPCT in the last 72 hours. Urine analysis:    Component Value Date/Time   COLORURINE YELLOW 08/16/2018 1800   APPEARANCEUR CLEAR 08/16/2018 1800   LABSPEC 1.029 08/16/2018 1800  PHURINE 5.0 08/16/2018 1800   GLUCOSEU >=500 (A) 08/16/2018 1800   HGBUR LARGE (A) 08/16/2018 1800   BILIRUBINUR NEGATIVE 08/16/2018 1800   KETONESUR 5 (A) 08/16/2018 1800   PROTEINUR 100 (A) 08/16/2018 1800   NITRITE NEGATIVE 08/16/2018 1800   LEUKOCYTESUR NEGATIVE 08/16/2018 1800   Sepsis Labs: @LABRCNTIP (procalcitonin:4,lacticidven:4)  ) Recent Results (from the past 240 hour(s))  SARS Coronavirus 2 (CEPHEID - Performed in [facility] lab), Hosp Order     Status: None   Collection Time: 08/16/18  6:03 PM   Specimen: Nasopharyngeal Swab  Result Value Ref Range Status   SARS Coronavirus 2 NEGATIVE NEGATIVE Final    Comment: (NOTE) If result is NEGATIVE SARS-CoV-2 target nucleic acids are NOT DETECTED. The SARS-CoV-2 RNA is generally detectable in upper and lower  respiratory specimens during the acute phase of infection. The lowest  concentration of SARS-CoV-2 viral copies this assay can detect is 250  copies / mL. A negative result does not preclude SARS-CoV-2 infection  and should not be used as the sole basis for treatment or other  patient  management decisions.  A negative result may occur with  improper specimen collection / handling, submission of specimen other  than nasopharyngeal swab, presence of viral mutation(s) within the  areas targeted by this assay, and inadequate number of viral copies  (<250 copies / mL). A negative result must be combined with clinical  observations, patient history, and epidemiological information. If result is POSITIVE SARS-CoV-2 target nucleic acids are DETECTED. The SARS-CoV-2 RNA is generally detectable in upper and lower  respiratory specimens dur ing the acute phase of infection.  Positive  results are indicative of active infection with SARS-CoV-2.  Clinical  correlation with patient history and other diagnostic information is  necessary to determine patient infection status.  Positive results do  not rule out bacterial infection or co-infection with other viruses. If result is PRESUMPTIVE POSTIVE SARS-CoV-2 nucleic acids MAY BE PRESENT.   A presumptive positive result was obtained on the submitted specimen  and confirmed on repeat testing.  While 2019 novel coronavirus  (SARS-CoV-2) nucleic acids may be present in the submitted sample  additional confirmatory testing may be necessary for epidemiological  and / or clinical management purposes  to differentiate between  SARS-CoV-2 and other Sarbecovirus currently known to infect humans.  If clinically indicated additional testing with an alternate test  methodology [phone]([id number]) is advised. The SARS-CoV-2 RNA is generally  detectable in upper and lower respiratory sp ecimens during the acute  phase of infection. The expected result is Negative. Fact Sheet for Patients:  BoilerBrush.com.cyhttps://www.fda.gov/media/136312/download Fact Sheet for Healthcare Providers: https://pope.com/https://www.fda.gov/media/136313/download This test is not yet approved or cleared by the [place] FDA and has been authorized for detection and/or diagnosis of SARS-CoV-2 by FDA under  an Emergency Use Authorization (EUA).  This EUA will remain in effect (meaning this test can be used) for the duration of the COVID-19 declaration under Section 564(b)(1) of the Act, 21 U.S.C. section 360bbb-3(b)(1), unless the authorization is terminated or revoked sooner. Performed at [facility], [address] [place], [place] [address]   MRSA PCR Screening     Status: None   Collection Time: 08/16/18  9:06 PM   Specimen: Nasal Mucosa; Nasopharyngeal  Result Value Ref Range Status   MRSA by PCR NEGATIVE NEGATIVE Final    Comment:        The GeneXpert MRSA Assay (FDA approved for NASAL specimens only), is one component of  a comprehensive MRSA colonization surveillance program. It is not intended to diagnose MRSA infection nor to guide or monitor treatment for MRSA infections. Performed at [facility], [address] [place], [place] [address]       Studies: Ct Head Wo Contrast  Result Date: 08/16/2018 CLINICAL DATA:  Drowsiness and disorientation. EXAM: CT HEAD WITHOUT CONTRAST TECHNIQUE: Contiguous axial images were obtained from the base of the skull through the vertex without intravenous contrast. COMPARISON:  None. FINDINGS: Brain: No evidence of acute infarction, hemorrhage, hydrocephalus, extra-axial collection or mass lesion/mass effect. Vascular: No hyperdense vessel or unexpected calcification. Skull: Normal. Negative for fracture or focal lesion. Sinuses/Orbits: No acute finding. Other: None. IMPRESSION: No acute intracranial abnormality. Electronically Signed   By: Ted Mcalpineobrinka  Dimitrova M.D.   On: 08/16/2018 18:51   Dg Chest Port 1 View  Result Date: 08/16/2018 CLINICAL DATA:  Dyspnea EXAM: PORTABLE CHEST 1 VIEW COMPARISON:  None. FINDINGS: The heart size and mediastinal contours are within normal limits. Both lungs are clear. The visualized skeletal structures are unremarkable. IMPRESSION: No active disease. Electronically Signed    By: Alcide CleverMark  Lukens M.D.   On: 08/16/2018 17:56    Scheduled Meds: . Chlorhexidine Gluconate Cloth  6 each Topical Daily  . heparin  5,000 Units Subcutaneous Q8H  . nicotine  21 mg Transdermal Daily    Continuous Infusions: . sodium chloride Stopped (08/17/18 0628)  . dextrose 5 % and 0.45% NaCl 125 mL/hr at 08/17/18 0626  . dextrose 5 % and 0.45% NaCl Stopped (08/16/18 1949)  . insulin 5.4 Units/hr (08/16/18 2010)     LOS: 1 day     Patrick Droparole N Cadon Raczka, MD [facility] Pager [phone]  If 7PM-7AM, please contact night-coverage [email or web address] Password [email or web address] 08/17/2018, 7:45 AM

## 2018-08-17 NOTE — Progress Notes (Signed)
Inpatient Diabetes Program Recommendations  AACE/ADA: New Consensus Statement on Inpatient Glycemic Control (2015)  Target Ranges:  Prepandial:   less than 140 mg/dL      Peak postprandial:   less than 180 mg/dL (1-2 hours)      Critically ill patients:  140 - 180 mg/dL   Lab Results  Component Value Date   GLUCAP 183 (H) 08/17/2018    Review of Glycemic Control  Diabetes history: DM2 Outpatient Diabetes medications: None (was on metformin at one time, but never got prescription refilled) Current orders for Inpatient glycemic control: Lantus 10 units QD, Novolog 0-9 units tidwc and hs  HgbA1C pending. Pt has not eaten full meal tray - only snacks.  Inpatient Diabetes Program Recommendations:     Add Novolog 3 units tidwc for meal coverage insulin if pt eats > 50% meal Will likely need more basal insulin.  Spoke with pt regarding his diabetes diagnosis. Pt states he just got insurance with his company and will need a PCP. He also needs very affordable insulin, prefers pens. Discussed basic pathophysiology of DM Type 2, basic home care, importance of checking CBGs and maintaining good CBG control to prevent long-term and short-term complications.   Will discuss HgbA1C results when available.  Will order insulin pen starter kit and teach insulin pen administration on 7/8. Care manager consult for PCP, assistance with meds  Pt was able to stick finger for monitoring his blood sugar. RN states she will allow pt to give his own insulin at next dose. Living Well book ordered OP Diabetes Education consult  Follow-up in am.  Thank you. Lorenda Peck, RD, LDN, CDE Inpatient Diabetes Coordinator [phone]

## 2018-08-17 NOTE — Progress Notes (Signed)
CRITICAL VALUE ALERT  Critical Value:  Na - 161  Date & Time Notied:  08/17/18 0931  Provider Notified: Yes   Orders Received/Actions taken: Awaiting orders; MD aware

## 2018-08-17 NOTE — Progress Notes (Signed)
Patient educated on diabetes and how to better manage lifestyle. We discussed diet, exercise and alternatives to sodas he drinks. Pt. Informed on Diabetic coordinator to visit him and bringing him education material. Patient had a few question and RN answered appropriately. Will continue to educate patient as necessary.

## 2018-08-18 LAB — GLUCOSE, CAPILLARY
Glucose-Capillary: 446 mg/dL — ABNORMAL HIGH (ref 70–99)
Glucose-Capillary: 393 mg/dL — ABNORMAL HIGH (ref 70–99)
Glucose-Capillary: 321 mg/dL — ABNORMAL HIGH (ref 70–99)
Glucose-Capillary: 189 mg/dL — ABNORMAL HIGH (ref 70–99)

## 2018-08-18 LAB — BASIC METABOLIC PANEL
Anion gap: 7 (ref 5–15)
BUN: 30 mg/dL — ABNORMAL HIGH (ref 6–20)
CO2: 18 mmol/L — ABNORMAL LOW (ref 22–32)
Calcium: 8.7 mg/dL — ABNORMAL LOW (ref 8.9–10.3)
Chloride: 126 mmol/L — ABNORMAL HIGH (ref 98–111)
Creatinine, Ser: 1.67 mg/dL — ABNORMAL HIGH (ref 0.61–1.24)
GFR calc Af Amer: 55 mL/min — ABNORMAL LOW (ref >60)
GFR calc non Af Amer: 48 mL/min — ABNORMAL LOW (ref >60)
Glucose, Bld: 327 mg/dL — ABNORMAL HIGH (ref 70–99)
Potassium: 4.5 mmol/L (ref 3.5–5.1)
Sodium: 151 mmol/L — ABNORMAL HIGH (ref 135–145)

## 2018-08-18 LAB — HEMOGLOBIN A1C
Hgb A1c MFr Bld: 12.8 % — ABNORMAL HIGH (ref 4.8–5.6)
Mean Plasma Glucose: 321 mg/dL

## 2018-08-18 LAB — TSH: TSH: 1.224 u[IU]/mL (ref 0.350–4.500)

## 2018-08-18 LAB — CK: Total CK: 2885 U/L — ABNORMAL HIGH (ref 49–397)

## 2018-08-18 MED ORDER — INSULIN ASPART 100 UNIT/ML ~~LOC~~ SOLN
10.0000 [IU] | Freq: Three times a day (TID) | SUBCUTANEOUS | Status: DC
  Administered 2018-08-18 (×3): 10 [IU] via SUBCUTANEOUS

## 2018-08-18 MED ORDER — INSULIN GLARGINE 100 UNIT/ML ~~LOC~~ SOLN
10.0000 [IU] | Freq: Once | SUBCUTANEOUS | Status: AC
  Administered 2018-08-18: 10 [IU] via SUBCUTANEOUS
  Filled 2018-08-18: qty 0.1

## 2018-08-18 MED ORDER — INSULIN ASPART 100 UNIT/ML ~~LOC~~ SOLN
12.0000 [IU] | Freq: Three times a day (TID) | SUBCUTANEOUS | Status: DC
  Administered 2018-08-19: 12 [IU] via SUBCUTANEOUS

## 2018-08-18 MED ORDER — METOPROLOL TARTRATE 12.5 MG HALF TABLET
12.5000 mg | ORAL_TABLET | Freq: Two times a day (BID) | ORAL | Status: DC
  Administered 2018-08-18 – 2018-08-19 (×3): 12.5 mg via ORAL
  Filled 2018-08-18 (×3): qty 1

## 2018-08-18 MED ORDER — INSULIN GLARGINE 100 UNIT/ML ~~LOC~~ SOLN
20.0000 [IU] | Freq: Every day | SUBCUTANEOUS | Status: DC
  Administered 2018-08-18: 20 [IU] via SUBCUTANEOUS
  Filled 2018-08-18: qty 0.2

## 2018-08-18 MED ORDER — AMLODIPINE BESYLATE 10 MG PO TABS
10.0000 mg | ORAL_TABLET | Freq: Every day | ORAL | Status: DC
  Administered 2018-08-18 – 2018-08-19 (×2): 10 mg via ORAL
  Filled 2018-08-18 (×2): qty 1

## 2018-08-18 MED ORDER — INSULIN ASPART 100 UNIT/ML ~~LOC~~ SOLN: 7.0000 [IU] | Freq: Three times a day (TID) | SUBCUTANEOUS | Status: DC

## 2018-08-18 MED ORDER — INSULIN GLARGINE 100 UNIT/ML ~~LOC~~ SOLN
30.0000 [IU] | Freq: Every day | SUBCUTANEOUS | Status: DC
  Administered 2018-08-19: 30 [IU] via SUBCUTANEOUS
  Filled 2018-08-18: qty 0.3

## 2018-08-18 NOTE — Progress Notes (Signed)
Family member updated on patient transfer to [place]. Heriberto Antigua (patient sister) stated she will update family members and may call on her lunch break at 1730.

## 2018-08-18 NOTE — TOC Benefit Eligibility Note (Signed)
Transition of Care [facility]) Benefit Eligibility Note    Patient Details  Name: Patrick Moody MRN: [id number] Date of Birth: [birth year]   Medication/Dose: 2 pens for 30days  Covered?: Yes(Novolog, Lantus and Levemir pens covered, Humolog is non formulary)  Tier: 2 Drug  Prescription Coverage Preferred Pharmacy: local pharmacy  Spoke with Person/Company/Phone Number:: Cassie/ [employer] [phone]  Co-Pay: Novolog, Lantus and Levemir pens cost is $45.00  Prior Approval: No(Humalog requires prior auth [phone])  Deductible: Met       Kerin Salen Phone Number: 08/18/2018, 9:46 AM

## 2018-08-18 NOTE — Progress Notes (Signed)
Inpatient Diabetes Program Recommendations  AACE/ADA: New Consensus Statement on Inpatient Glycemic Control (2015)  Target Ranges:  Prepandial:   less than 140 mg/dL      Peak postprandial:   less than 180 mg/dL (1-2 hours)      Critically ill patients:  140 - 180 mg/dL   Lab Results  Component Value Date   GLUCAP 321 (H) 08/18/2018   HGBA1C 12.8 (H) 08/17/2018    Review of Glycemic Control  CBGs today - [phone] mg/dL  Inpatient Diabetes Program Recommendations:     Increase Lantus to 30 units QD Increase Novolog to 12 units tidwc for meal coverage insulin  Appreciate assistance of care management for insulin pen copay information. Pt able to afford $45 for both Lantus and Novolog pen.  Continued discussion of diabetes. Pt works 3rd shift and will need PCP to manage his diabetes. Asked good questions and seems motivated to control blood sugars. Has given insulin and stuck fingers for CBGs without problems.   Educated patient on insulin pen use at home. Reviewed contents of insulin flexpen starter kit. Reviewed all steps if insulin pen including attachment of needle, 2-unit air shot, dialing up dose, giving injection, removing needle, disposal of sharps, storage of unused insulin, disposal of insulin etc. Patient able to provide successful return demonstration. Also reviewed troubleshooting with insulin pen. MD to give patient Rxs for insulin pens and insulin pen needles.  Thank you. Lorenda Peck, RD, LDN, CDE Inpatient Diabetes Coordinator [phone]

## 2018-08-18 NOTE — Progress Notes (Addendum)
PROGRESS NOTE  Patrick Moody IZT:[id number] DOB: July 22, 1970 DOA: 08/16/2018 PCP: Patient, No Pcp Per  HPI/Recap of past 24 hours: Patrick Moody is a 48 y.o. male with medical history significant of HTN, tobacco abuse, possible DM, who presents with altered mental status.  Patient has AMS, and cannot provide accurate medical history, therefore, most of the history is obtained by discussing the case with ED physician, per EMS report, and with the nursing staff.  Per EDP, pt sister reported to EDP that pt has been confused in the past 2-3 days. Family reports that the patient works in a hot environment and they feel that he may be dehydrated. Pt has hx of elevated sugars in the past, but is not currently taking any medications for diabetes. Patient has history of hypertension, but probably does not take medications currently.  Patient does not have active cough, respiratory distress, nausea vomiting, diarrhea noted.  He moves all extremities.  No facial droop or slurred speech. Pt states that he has diffused abdominal discomfort, but no nausea vomiting, diarrhea.  No fever or chills.  ED Course: pt was found to have DKA (with blood sugar>1128, bicarbonate 19, anion gap 24), negative COVID-19 test, alcohol level less than 10, WBC 12.8, lactic acid 4.0, calcium 10.6, AKI with creatinine 2.80, BUN 57, INR 1.1, negative UDS, ABG with pH 7.25, pCO2 37, PO2 77.3.  Temperature normal, tachycardia, tachypnea, oxygen saturation 94-96% on room air, blood pressure 139/87.  Negative chest x-ray, CT head is negative for acute intracranial abnormalities.  Patient is admitted to stepdown as inpatient.  08/18/18: Patient was seen and examined at his bedside this morning.  Admits to mild nausea with no vomiting.  Hyperglycemic this morning with CBG greater than 400.  Increased insulin doses Lantus to 20 units daily, NovoLog to 10 units 3 times daily.  Also hypertensive, increased Norvasc to 10 mg daily, holding HCTZ  due to AKI.  Worsening CPK on IV fluid.  Patient reveals lifts heavy weight at work.    Assessment/Plan: Principal Problem:   DKA, type 1 (HCC) Active Problems:   Essential hypertension, benign   Dehydration   Hypercalcemia   AKI (acute kidney injury) ([place])   Acute metabolic encephalopathy   Lactic acidosis   Leukocytosis   Tobacco abuse   Newly diagnosed diabetes ([place])   Rhabdomyolysis  Newly diagnosed diabetes complicated by DKA, likely type II Presented with blood sugar greater than 1120, bicarbonate 19 and anion gap of 24 Hemoglobin A1c is still pending Hypoglycemic this morning with CBG greater than 400 Lantus increased to 20 units daily NovoLog increased to 10 units 3 times daily Continue insulin sliding scale, sensitive Avoid hypoglycemia Diabetes coordinator following, greatly appreciated.  Uncontrolled hypertension Continue to hold off HCTZ due to AKI Increase Norvasc dose which was started here in the hospital to 10 mg daily Continue to monitor vital signs  AKI, unclear of baseline No prior history to compare Presented with creatinine of 3.5 Creatinine continues to trend down and improving Creatinine today 1.67 Continue to avoid nephrotoxins Continue IV fluid hydration on half-normal saline at 100 cc/h, increased rate to 125 cc/h due to rhabdomyolysis  Non-anion gap metabolic acidosis In the setting of rhabdomyolysis and acute kidney injury DKA has resolved Bicarb 18, anion gap 7 Repeat chemistry panel in the morning  Rhabdomyolysis, unclear etiology Patient reveals lifting heavy weight at work, been there for 1 year CPK continues to trend up from 1269  To 2226 to 2885 Increase IV fluid  rate to 125 cc/h of half-normal saline Negative UDS  Hypovolemic hypernatremia Sodium as high as 161 Last sodium level 151 this morning Avoid quick correction Increase IV fluid rate to 125 cc/h due to above  Hyperlipidemia LDL unable to calculate Continue Lipitor  40 daily  Acute metabolic encephalopathy likely secondary to DKA with severe dehydration CT head unremarkable for any acute intracranial findings. Alert and oriented x3 however slow to responding to questions  Dehydration in the setting of DKA Continue IV fluid hydration Continue to monitor urine output   DVT ppx: SQ Heparin 3 times daily     Code Status: Full code Family Communication: None at bed side.      Disposition Plan:   Possible discharge to home in 1 to 2 days once blood sugar is better controlled and rhabdomyolysis is improving.  Consults called:  none     Objective: Vitals:   08/18/18 0441 08/18/18 0500 08/18/18 0600 08/18/18 0625  BP:   (!) 184/108   Pulse: 96 99    Resp: _0 Temp:      TempSrc:      SpO2: 96% 98%  98%  Weight:      Height:        Intake/Output Summary (Last 24 hours) at 08/18/2018 0814 Last data filed at 08/18/2018 0600 Gross per 24 hour  Intake 3254.81 ml  Output 1800 ml  Net 1454.81 ml   Filed Weights   08/16/18 1719  Weight: 76.2 kg    Exam:  . General: 48 y.o. year-old male well-developed well-nourished in no acute distress.  Alert and oriented x3.   . Cardiovascular: Regular rate and rhythm with no rubs or gallops.  No JVD or thyromegaly noted.   Marland Kitchen Respiratory: Clear to auscultation with no wheezes or rales.  Poor inspiratory effort. . Abdomen: Soft nontender nondistended with normal bowel sounds x4 quadrants.  . Musculoskeletal: No lower extremity edema.  2 out of 4 pulses in all 4 extremities.. . Psychiatry: Mood is appropriate for condition and setting.  Data Reviewed: CBC: Recent Labs  Lab 08/16/18 1729 08/16/18 1746  WBC 12.8*  --   NEUTROABS 11.1*  --   HGB 16.9 17.7*  HCT 56.7* 52.0  MCV 89.7  --   PLT 463*  --    Basic Metabolic Panel: Recent Labs  Lab 08/17/18 0217 08/17/18 0857 08/17/18 1303 08/17/18 1832 08/18/18 0230  NA 155* 161* 158* 158* 151*  K 4.6 3.9 3.4* 4.7 4.5  CL 124* 130*  129* 123* 126*  CO2 20* 21* 23 21* 18*  GLUCOSE 405* 138* 177* 266* 327*  BUN 50* 41* 37* 34* 30*  CREATININE 2.05* 1.72* 1.84* 1.63* 1.67*  CALCIUM 9.3 9.3 9.0 8.9 8.7*  MG  --   --   --  2.8*  --    GFR: Estimated Creatinine Clearance: 58.3 mL/min (A) (by C-G formula based on SCr of 1.67 mg/dL (H)). Liver Function Tests: Recent Labs  Lab 08/16/18 1729  AST 26  ALT 42  ALKPHOS 146*  BILITOT 1.3*  PROT 9.6*  ALBUMIN 4.7   Recent Labs  Lab 08/16/18 1946  LIPASE 48   No results for input(s): AMMONIA in the last 168 hours. Coagulation Profile: Recent Labs  Lab 08/16/18 1729  INR 1.1   Cardiac Enzymes: Recent Labs  Lab 08/16/18 1947 08/17/18 0217 08/18/18 0230  CKTOTAL 1,269* 2,226* 2,885*   BNP (last 3 results) No results for input(s): PROBNP in the last  8760 hours. HbA1C: No results for input(s): HGBA1C in the last 72 hours. CBG: Recent Labs  Lab 08/17/18 1359 08/17/18 1501 08/17/18 1600 08/17/18 2148 08/18/18 0746  GLUCAP 173* 184* 183* 319* 446*   Lipid Profile: Recent Labs    08/17/18 0217  CHOL 533*  HDL 54  LDLCALC UNABLE TO CALCULATE IF TRIGLYCERIDE OVER 400 mg/dL  TRIG 796*  CHOLHDL 9.9  LDLDIRECT 274.5*   Thyroid Function Tests: Recent Labs    08/18/18 0528  TSH 1.224   Anemia Panel: No results for input(s): VITAMINB12, FOLATE, FERRITIN, TIBC, IRON, RETICCTPCT in the last 72 hours. Urine analysis:    Component Value Date/Time   COLORURINE YELLOW 08/16/2018 1800   APPEARANCEUR CLEAR 08/16/2018 1800   LABSPEC 1.029 08/16/2018 1800   PHURINE 5.0 08/16/2018 1800   GLUCOSEU >=500 (A) 08/16/2018 1800   HGBUR LARGE (A) 08/16/2018 1800   BILIRUBINUR NEGATIVE 08/16/2018 1800   KETONESUR 5 (A) 08/16/2018 1800   PROTEINUR 100 (A) 08/16/2018 1800   NITRITE NEGATIVE 08/16/2018 1800   LEUKOCYTESUR NEGATIVE 08/16/2018 1800   Sepsis Labs: _0 (procalcitonin:4,lacticidven:4)  ) Recent Results (from the past 240 hour(s))  Urine  Culture     Status: None   Collection Time: 08/16/18  6:00 PM   Specimen: Urine, Clean Catch  Result Value Ref Range Status   Specimen Description   Final    URINE, CLEAN CATCH Performed at [facility], [place] [address], [place], [place] [address]    Special Requests   Final    NONE Performed at [facility], [place] [address] [place], [place] [address]    Culture   Final    NO GROWTH Performed at [facility],  [address] [place], [place] [address]    Report Status 08/17/2018 FINAL  Final  SARS Coronavirus 2 (CEPHEID - Performed in [place] hospital lab), Hosp Order     Status: None   Collection Time: 08/16/18  6:03 PM   Specimen: Nasopharyngeal Swab  Result Value Ref Range Status   SARS Coronavirus 2 NEGATIVE NEGATIVE Final    Comment: (NOTE) If result is NEGATIVE SARS-CoV-2 target nucleic acids are NOT DETECTED. The SARS-CoV-2 RNA is generally detectable in upper and lower  respiratory specimens during the acute phase of infection. The lowest  concentration of SARS-CoV-2 viral copies this assay can detect is 250  copies / mL. A negative result does not preclude SARS-CoV-2 infection  and should not be used as the sole basis for treatment or other  patient management decisions.  A negative result may occur with  improper specimen collection / handling, submission of specimen other  than nasopharyngeal swab, presence of viral mutation(s) within the  areas targeted by this assay, and inadequate number of viral copies  (<250 copies / mL). A negative result must be combined with clinical  observations, patient history, and epidemiological information. If result is POSITIVE SARS-CoV-2 target nucleic acids are DETECTED. The SARS-CoV-2 RNA is generally detectable in upper and lower  respiratory specimens dur ing the acute phase of infection.  Positive  results are indicative of active infection with SARS-CoV-2.  Clinical  correlation  with patient history and other diagnostic information is  necessary to determine patient infection status.  Positive results do  not rule out bacterial infection or co-infection with other viruses. If result is PRESUMPTIVE POSTIVE SARS-CoV-2 nucleic acids MAY BE PRESENT.   A presumptive positive result was obtained on the submitted specimen  and confirmed on  repeat testing.  While 2019 novel coronavirus  (SARS-CoV-2) nucleic acids may be present in the submitted sample  additional confirmatory testing may be necessary for epidemiological  and / or clinical management purposes  to differentiate between  SARS-CoV-2 and other Sarbecovirus currently known to infect humans.  If clinically indicated additional testing with an alternate test  methodology [phone]) is advised. The SARS-CoV-2 RNA is generally  detectable in upper and lower respiratory sp ecimens during the acute  phase of infection. The expected result is Negative. Fact Sheet for Patients:  StrictlyIdeas.no Fact Sheet for Healthcare Providers: BankingDealers.co.za This test is not yet approved or cleared by the [place] FDA and has been authorized for detection and/or diagnosis of SARS-CoV-2 by FDA under an Emergency Use Authorization (EUA).  This EUA will remain in effect (meaning this test can be used) for the duration of the COVID-19 declaration under Section 564(b)(1) of the Act, 21 U.S.C. section 360bbb-3(b)(1), unless the authorization is terminated or revoked sooner. Performed at [facility], [place] [address] [place], [place] [address]   Culture, blood (Routine X 2) w Reflex to ID Panel     Status: None (Preliminary result)   Collection Time: 08/16/18  7:47 PM   Specimen: BLOOD  Result Value Ref Range Status   Specimen Description   Final    BLOOD RIGHT ANTECUBITAL Performed at [place] [address], [place], [place]  [address]    Special Requests   Final    BOTTLES DRAWN AEROBIC AND ANAEROBIC Blood Culture adequate volume Performed at [place] [address] [place], [place] [address]    Culture   Final    NO GROWTH < 12 HOURS Performed at [place] [address] [place], [place] [address]    Report Status PENDING  Incomplete  MRSA PCR Screening     Status: None   Collection Time: 08/16/18  9:06 PM   Specimen: Nasal Mucosa; Nasopharyngeal  Result Value Ref Range Status   MRSA by PCR NEGATIVE NEGATIVE Final    Comment:        The GeneXpert MRSA Assay (FDA approved for NASAL specimens only), is one component of a comprehensive MRSA colonization surveillance program. It is not intended to diagnose MRSA infection nor to guide or monitor treatment for MRSA infections. Performed at [facility], [place] [address], [place], [place] [address]   Culture, blood (Routine X 2) w Reflex to ID Panel     Status: None (Preliminary result)   Collection Time: 08/16/18  9:44 PM   Specimen: BLOOD  Result Value Ref Range Status   Specimen Description   Final    BLOOD BLOOD RIGHT WRIST Performed at [place] [address] [place], [place] [address]    Special Requests   Final    BOTTLES DRAWN AEROBIC ONLY Blood Culture adequate volume Performed at [place] [address] [place], [place] [address]    Culture   Final    NO GROWTH < 12 HOURS Performed at [place] [address], [place], [place] [address]    Report Status PENDING  Incomplete      Studies: No results found.  Scheduled Meds: . amLODipine  10 mg Oral Daily  . atorvastatin  40 mg Oral q1800  . Chlorhexidine Gluconate Cloth  6 each Topical Daily  . heparin  5,000 Units Subcutaneous Q8H  . insulin aspart  0-5  Units Subcutaneous QHS  . insulin aspart  0-9 Units Subcutaneous TID WC  . insulin aspart  10 Units Subcutaneous TID WC   . insulin glargine  20 Units Subcutaneous Daily  . insulin starter kit- pen needles  1 kit Other Once  . living well with diabetes book   Does not apply Once  . nicotine  21 mg Transdermal Daily    Continuous Infusions: . sodium chloride 100 mL/hr at 08/18/18 0500     LOS: 2 days     Kayleen Memos, MD [facility] Pager [phone]  If 7PM-7AM, please contact night-coverage [email or web address] Password [email or web address] 08/18/2018, 8:14 AM

## 2018-08-19 LAB — BASIC METABOLIC PANEL
Anion gap: 6 (ref 5–15)
BUN: 21 mg/dL — ABNORMAL HIGH (ref 6–20)
CO2: 22 mmol/L (ref 22–32)
Calcium: 8 mg/dL — ABNORMAL LOW (ref 8.9–10.3)
Chloride: 114 mmol/L — ABNORMAL HIGH (ref 98–111)
Creatinine, Ser: 1.2 mg/dL (ref 0.61–1.24)
GFR calc Af Amer: >60 mL/min (ref >60)
GFR calc non Af Amer: >60 mL/min (ref >60)
Glucose, Bld: 255 mg/dL — ABNORMAL HIGH (ref 70–99)
Potassium: 3.8 mmol/L (ref 3.5–5.1)
Sodium: 142 mmol/L (ref 135–145)

## 2018-08-19 LAB — GLUCOSE, CAPILLARY
Glucose-Capillary: 260 mg/dL — ABNORMAL HIGH (ref 70–99)
Glucose-Capillary: 223 mg/dL — ABNORMAL HIGH (ref 70–99)

## 2018-08-19 LAB — CK: Total CK: 836 U/L — ABNORMAL HIGH (ref 49–397)

## 2018-08-19 MED ORDER — METOPROLOL TARTRATE 25 MG PO TABS: 12.5000 mg | ORAL_TABLET | Freq: Two times a day (BID) | ORAL | 0 refills | Status: DC

## 2018-08-19 MED ORDER — ATORVASTATIN CALCIUM 40 MG PO TABS: 40.0000 mg | ORAL_TABLET | Freq: Every day | ORAL | 0 refills | Status: DC

## 2018-08-19 MED ORDER — INSULIN ASPART 100 UNIT/ML FLEXPEN: 12.0000 [IU] | PEN_INJECTOR | Freq: Three times a day (TID) | SUBCUTANEOUS | 0 refills | Status: DC

## 2018-08-19 MED ORDER — NICOTINE 21 MG/24HR TD PT24: 21.0000 mg | MEDICATED_PATCH | Freq: Every day | TRANSDERMAL | 0 refills | Status: AC

## 2018-08-19 MED ORDER — BLOOD GLUCOSE MONITOR KIT: PACK | 0 refills | Status: AC

## 2018-08-19 MED ORDER — AMLODIPINE BESYLATE 10 MG PO TABS: 10.0000 mg | ORAL_TABLET | Freq: Every day | ORAL | 0 refills | Status: DC

## 2018-08-19 MED ORDER — INSULIN STARTER KIT- PEN NEEDLES (ENGLISH): 1.0000 | Freq: Once | 0 refills | Status: AC

## 2018-08-19 MED ORDER — INSULIN GLARGINE 100 UNIT/ML SOLOSTAR PEN: 30.0000 [IU] | PEN_INJECTOR | Freq: Every day | SUBCUTANEOUS | 0 refills | Status: DC

## 2018-08-19 NOTE — Progress Notes (Signed)
Inpatient Diabetes Program Recommendations  AACE/ADA: New Consensus Statement on Inpatient Glycemic Control (2015)  Target Ranges:  Prepandial:   less than 140 mg/dL      Peak postprandial:   less than 180 mg/dL (1-2 hours)      Critically ill patients:  140 - 180 mg/dL   Lab Results  Component Value Date   GLUCAP 260 (H) 08/19/2018   HGBA1C 12.8 (H) 08/17/2018    Review of Glycemic Control  CBGs 255, 260 this morning. Lantus increased to 30 units QD Novolog increased to 12 units tidwc  Inpatient Diabetes Program Recommendations:     Care manager asked to assist with obtaining appt at [facility] for PCP. May be referred to Endo from PCP.  Pt will need prescriptions for: Lantus Solostar pen Novolog Flexpen Insulin pen needles Blood glucose monitoring kit  Has OP Diabetes Education consult for new onset DM.  Pt seems very motivated to control his blood sugars. Questions answered and survival skills have been addressed.  Thank you. Lorenda Peck, RD, LDN, CDE Inpatient Diabetes Coordinator [phone]

## 2018-08-19 NOTE — Discharge Summary (Signed)
°Discharge Summary ° °Patrick Moody MRN:[id number] DOB: 09/19/1970 ° °PCP: Patient, No Pcp Per ° °Admit date: 08/16/2018 °Discharge date: 08/19/2018 ° °Time spent: 35 minutes ° °Recommendations for Outpatient Follow-up:  °1. Follow-up with PCP and obtain endocrinology referral from PCP if needed. °2. Take your medications as prescribed °3. Completely abstain from tobacco use. ° °Discharge Diagnoses:  °Active Hospital Problems  ° Diagnosis Date Noted  °• DKA, type 1 (HCC) 08/16/2018  °• Rhabdomyolysis 08/17/2018  °• Dehydration 08/16/2018  °• Hypercalcemia 08/16/2018  °• AKI (acute kidney injury) (HCC) 08/16/2018  °• Acute metabolic encephalopathy 08/16/2018  °• Lactic acidosis 08/16/2018  °• Leukocytosis 08/16/2018  °• Newly diagnosed diabetes (HCC) 08/16/2018  °• Tobacco abuse   °• Essential hypertension, benign 01/16/2016  °  °Resolved Hospital Problems  °No resolved problems to display.  ° ° °Discharge Condition: Stable ° °Diet recommendation: Heart healthy carb modified diet. ° °Vitals:  ° 08/18/18 2105 08/19/18 0554  °BP: 134/86 133/88  °Pulse: 90 88  °Resp: 20 18  °Temp: 98.1 °F (36.7 °C) 98.2 °F (36.8 °C)  °SpO2: 97% 97%  ° ° °History of present illness:  °Patrick Moody is a 47 y.o. male with medical history significant of HTN, tobacco use disorder, who presents with altered mental status.  Most of the history is obtained by ED physician report, EMS report, and the nursing staff. °  °Per EDP, pt sister reported to EDP that pt has been confused in the past 2-3 days. Family reports that the patient works in a hot environment and they feel that he may be dehydrated. Pt has hx of elevated sugars in the past, but is not currently taking any medications for diabetes.  Patient has history of hypertension, but probably does not take medications currently.   °  °ED Course: pt was found to have DKA (with blood sugar>1128, bicarbonate 19, anion gap 24), negative COVID-19 test, alcohol level less than 10, WBC 12.8, lactic  acid 4.0, calcium 10.6, AKI with creatinine 2.80, BUN 57, INR 1.1, negative UDS, ABG with pH 7.25, pCO2 37, PO2 77.3.  Temperature normal, tachycardia, tachypnea, oxygen saturation 94-96% on room air.  Negative chest x-ray, CT head is negative for acute intracranial abnormalities.  Patient admitted to stepdown unit as inpatient status for treatment of DKA with newly diagnosed diabetes. ° °Diabetes coordinator consulted and assisted with diabetes education and management. ° °Hospital course complicated by rhabdomyolysis (lifts heavy weight at work) resolving with IV fluid hydration, uncontrolled hypertension, resolving with increase dose of Norvasc to 10 mg daily.   °  °08/19/18: Patient seen and examined at his bedside.  No acute events overnight.  Patient feels better.  Has tolerated a diet.  Hemoglobin A1c 12.8 on 08/17/2018.  Has received diabetes education in the hospital.  Patient will need to continue insulin coverage and follow-up with PCP outpatient.  If needed can be referred to endocrinology through his PCP. ° °Hospital Course:  °Principal Problem: °  DKA, type 1 (HCC) °Active Problems: °  Essential hypertension, benign °  Dehydration °  Hypercalcemia °  AKI (acute kidney injury) (HCC) °  Acute metabolic encephalopathy °  Lactic acidosis °  Leukocytosis °  Tobacco abuse °  Newly diagnosed diabetes (HCC) °  Rhabdomyolysis ° °Newly diagnosed diabetes complicated by DKA, likely type II °Presented with blood sugar greater than 1120, bicarbonate 19 and anion gap of 24 °Hemoglobin A1c 12.8 on 08/17/18. °Continue Lantus 30 units daily, NovoLog 12 units before meals °Follow-up with your primary care provider, if needed can obtain referral to endocrinology through your PCP °Diabetes coordinator provided diabetes education and assisted in the management of his diabetes °Heart healthy   healthy carb modified diet  Resolved uncontrolled hypertension Blood pressure is normotensive on 08/19/2018 Continue to hold off HCTZ due to  AKI Continue Norvasc 10 mg daily, started here in the hospital. Follow-up with your PCP  Resolving AKI, unclear of baseline No prior history to compare Presented with creatinine of 3.5 Creatinine continues to trend down and improve Creatinine today 08/19/2018, 1.20 with GFR greater than 60 Continue to avoid dehydration Continue to avoid nephrotoxins Follow-up with your PCP  Resolved non-anion gap metabolic acidosis In the setting of rhabdomyolysis and acute kidney injury DKA has resolved Bicarb 18, anion gap 7 Repeat chemistry panel in the morning  Resolving rhabdomyolysis, unclear etiology Patient reveals lifting heavy weight at work, been there for 1 year CPK trending down from 2885 to 836 on 08/19/2018. Negative UDS Patient was advised to avoid dehydration Follow-up with your PCP  Resolved hypovolemic hypernatremia Sodium as high as 161 on 08/17/2018 Sodium 142 on 08/19/2018. Received IV fluid hydration half-normal saline. Follow-up with your PCP Avoid dehydration  Hyperlipidemia LDL unable to calculate Continue Lipitor 40 daily, started in the hospital.  Resolved acute metabolic encephalopathy likely secondary to DKA with severe dehydration CT head unremarkable for any acute intracranial findings. Alert and oriented x3 however slow to responding to questions  Resolved dehydration in the setting of DKA Avoid dehydration    Code Status:Full code .  Consults called:Diabetes coordinator.    Discharge Exam: BP 133/88    Pulse 88    Temp 98.2 F (36.8 C) (Oral)    Resp 18    Ht 6' 1" (1.854 m)    Wt 76.2 kg    SpO2 97%    BMI 22.16 kg/m   General: 48 y.o. year-old male well developed well nourished in no acute distress.  Alert and oriented x3.  Cardiovascular: Regular rate and rhythm with no rubs or gallops.  No thyromegaly or JVD noted.    Respiratory: Clear to auscultation with no wheezes or rales. Good inspiratory effort.  Abdomen: Soft nontender  nondistended with normal bowel sounds x4 quadrants.  Musculoskeletal: No lower extremity edema. 2/4 pulses in all 4 extremities.  Skin: No ulcerative lesions noted or rashes,  Psychiatry: Mood is appropriate for condition and setting  Discharge Instructions You were cared for by a hospitalist during your hospital stay. If you have any questions about your discharge medications or the care you received while you were in the hospital after you are discharged, you can call the unit and asked to speak with the hospitalist on call if the hospitalist that took care of you is not available. Once you are discharged, your primary care physician will handle any further medical issues. Please note that NO REFILLS for any discharge medications will be authorized once you are discharged, as it is imperative that you return to your primary care physician (or establish a relationship with a primary care physician if you do not have one) for your aftercare needs so that they can reassess your need for medications and monitor your lab values.   Allergies as of 08/19/2018   No Known Allergies     Medication List    STOP taking these medications   cephALEXin 500 MG capsule Commonly known as: Keflex   cyclobenzaprine 10 MG tablet Commonly known as: FLEXERIL   hydrochlorothiazide 12.5 MG capsule Commonly known as: MICROZIDE   ibuprofen 200 MG tablet Commonly known as: ADVIL     TAKE these medications   amLODipine 10 MG tablet Commonly  as: NORVASC °Take 1 tablet (10 mg total) by mouth daily. °Start taking on: August 20, 2018 °  °atorvastatin 40 MG tablet °Commonly known as: LIPITOR °Take 1 tablet (40 mg total) by mouth daily at 6 PM. °  °blood glucose meter kit and supplies Kit °Dispense based on patient and insurance preference. Use up to four times daily as directed. (FOR ICD-9 250.00, 250.01). °  °insulin aspart 100 UNIT/ML FlexPen °Commonly known as: NOVOLOG °Inject 12 Units into the skin 3 (three)  times daily with meals. °  °Insulin Glargine 100 UNIT/ML Solostar Pen °Commonly known as: LANTUS °Inject 30 Units into the skin daily. °  °insulin starter kit- pen needles Misc °1 kit by Other route once for 1 dose. °  °metoprolol tartrate 25 MG tablet °Commonly known as: LOPRESSOR °Take 0.5 tablets (12.5 mg total) by mouth 2 (two) times daily. °  °nicotine 21 mg/24hr patch °Commonly known as: NICODERM CQ - dosed in mg/24 hours °Place 1 patch (21 mg total) onto the skin daily. °Start taking on: August 20, 2018 °  °  ° °No Known Allergies °Follow-up Information   ° For Primary care doctor Follow up.   °Why: Call number on the back of your insurance card to get a list of primary care providers in your network ° °  °  ° [facility]. Go on 09/08/2018.   °Why: appointment is at 2:30pm please there at 2:215pm to fill out paperwork °Contact information: °[address] °[place] [address] °[phone] ° °  °  °  ° ° ° °The results of significant diagnostics from this hospitalization (including imaging, microbiology, ancillary and laboratory) are listed below for reference.   ° °Significant Diagnostic Studies: °Ct Head Wo Contrast ° °Result Date: 08/16/2018 °CLINICAL DATA:  Drowsiness and disorientation. EXAM: CT HEAD WITHOUT CONTRAST TECHNIQUE: Contiguous axial images were obtained from the base of the skull through the vertex without intravenous contrast. COMPARISON:  None. FINDINGS: Brain: No evidence of acute infarction, hemorrhage, hydrocephalus, extra-axial collection or mass lesion/mass effect. Vascular: No hyperdense vessel or unexpected calcification. Skull: Normal. Negative for fracture or focal lesion. Sinuses/Orbits: No acute finding. Other: None. IMPRESSION: No acute intracranial abnormality. Electronically Signed   By: Dobrinka  Dimitrova M.D.   On: 08/16/2018 18:51  ° °Dg Chest Port 1 View ° °Result Date: 08/16/2018 °CLINICAL DATA:  Dyspnea EXAM: PORTABLE CHEST 1  VIEW COMPARISON:  None. FINDINGS: The heart size and mediastinal contours are within normal limits. Both lungs are clear. The visualized skeletal structures are unremarkable. IMPRESSION: No active disease. Electronically Signed   By: Mark  Lukens M.D.   On: 08/16/2018 17:56  ° ° °Microbiology: °Recent Results (from the past 240 hour(s))  °Urine Culture     Status: None  ° Collection Time: 08/16/18  6:00 PM  ° Specimen: Urine, Clean Catch  °Result Value Ref Range Status  ° Specimen Description   Final  °  URINE, CLEAN CATCH °Performed at [facility], [address] [place], [place] [address] °  ° Special Requests   Final  °  NONE °Performed at [facility], [address] [place], [place] [address] °  ° Culture   Final  °  NO GROWTH °Performed at [facility], [address] [place], [place] [address] °  ° Report Status 08/17/2018 FINAL  Final  °SARS Coronavirus 2 (CEPHEID - Performed in [place] hospital lab), Hosp Order       Status: None  ° Collection Time: 08/16/18  6:03 PM  ° Specimen: Nasopharyngeal Swab  °Result Value Ref Range Status  ° SARS Coronavirus 2 NEGATIVE NEGATIVE Final  °  Comment: (NOTE) °If result is NEGATIVE °SARS-CoV-2 target nucleic acids are NOT DETECTED. °The SARS-CoV-2 RNA is generally detectable in upper and lower  °respiratory specimens during the acute phase of infection. The lowest  °concentration of SARS-CoV-2 viral copies this assay can detect is 250  °copies / mL. A negative result does not preclude SARS-CoV-2 infection  °and should not be used as the sole basis for treatment or other  °patient management decisions.  A negative result may occur with  °improper specimen collection / handling, submission of specimen other  °than nasopharyngeal swab, presence of viral mutation(s) within the  °areas targeted by this assay, and inadequate number of viral copies  °(<250 copies / mL). A negative result must be combined with clinical    °observations, patient history, and epidemiological information. °If result is POSITIVE °SARS-CoV-2 target nucleic acids are DETECTED. °The SARS-CoV-2 RNA is generally detectable in upper and lower  °respiratory specimens dur °ing the acute phase of infection.  Positive  °results are indicative of active infection with SARS-CoV-2.  Clinical  °correlation with patient history and other diagnostic information is  °necessary to determine patient infection status.  Positive results do  °not rule out bacterial infection or co-infection with other viruses. °If result is PRESUMPTIVE POSTIVE °SARS-CoV-2 nucleic acids MAY BE PRESENT.   °A presumptive positive result was obtained on the submitted specimen  °and confirmed on repeat testing.  While 2019 novel coronavirus  °(SARS-CoV-2) nucleic acids may be present in the submitted sample  °additional confirmatory testing may be necessary for epidemiological  °and / or clinical management purposes  to differentiate between  °SARS-CoV-2 and other Sarbecovirus currently known to infect humans.  °If clinically indicated additional testing with an alternate test  °methodology ([id number]) is advised. The SARS-CoV-2 RNA is generally  °detectable in upper and lower respiratory sp °ecimens during the acute  °phase of infection. °The expected result is Negative. °Fact Sheet for Patients:  https://www.fda.gov/media/136312/download °Fact Sheet for Healthcare Providers: °https://www.fda.gov/media/136313/download °This test is not yet approved or cleared by the [place] FDA and °has been authorized for detection and/or diagnosis of SARS-CoV-2 by °FDA under an Emergency Use Authorization (EUA).  This EUA will remain °in effect (meaning this test can be used) for the duration of the °COVID-19 declaration under Section 564(b)(1) of the Act, 21 U.S.C. °section 360bbb-3(b)(1), unless the authorization is terminated or °revoked sooner. °Performed at [facility], [address] °[place], [place] [address] °  °Culture, blood (Routine X 2) w Reflex to ID Panel     Status: None (Preliminary result)  ° Collection Time: 08/16/18  7:47 PM  ° Specimen: BLOOD  °Result Value Ref Range Status  ° Specimen Description   Final  °  BLOOD RIGHT ANTECUBITAL °Performed at [facility], [address] [place], [place] [address] °  ° Special Requests   Final  °  BOTTLES DRAWN AEROBIC AND ANAEROBIC Blood Culture adequate volume °Performed at [facility], [address] [place], [place] [address] °  ° Culture   Final  °  NO GROWTH 2 DAYS °Performed at [facility], [address] [place],  [address] °  ° Report Status PENDING  Incomplete  °MRSA PCR Screening     Status: None  ° Collection   Time: 08/16/18  9:06 PM  ° Specimen: Nasal Mucosa; Nasopharyngeal  °Result Value Ref Range Status  ° MRSA by PCR NEGATIVE NEGATIVE Final  °  Comment:        °The GeneXpert MRSA Assay (FDA °approved for NASAL specimens °only), is one component of a °comprehensive MRSA colonization °surveillance program. It is not °intended to diagnose MRSA °infection nor to guide or °monitor treatment for °MRSA infections. °Performed at [facility], [address] [place], [place] [address] °  °Culture, blood (Routine X 2) w Reflex to ID Panel     Status: None (Preliminary result)  ° Collection Time: 08/16/18  9:44 PM  ° Specimen: BLOOD  °Result Value Ref Range Status  ° Specimen Description   Final  °  BLOOD BLOOD RIGHT WRIST °Performed at [facility], [address] [place], [place] [address] °  ° Special Requests   Final  °  BOTTLES DRAWN AEROBIC ONLY Blood Culture adequate volume °Performed at [facility], [address] [place], [place] [address] °  ° Culture   Final  °  NO GROWTH 2 DAYS °Performed at [facility], [address] [place], [place] [address] °  ° Report Status PENDING  Incomplete  °   ° °Labs: °Basic Metabolic Panel: °Recent Labs  °Lab 08/17/18 °0857 08/17/18 °1303 08/17/18 °1832 08/18/18 °0230 08/19/18 °0544  °NA 161* 158* 158* 151* 142  °K 3.9 3.4* 4.7 4.5 3.8  °CL 130* 129* 123* 126* 114*  °CO2 21* 23 21* 18* 22  °GLUCOSE 138* 177* 266* 327* 255*  °BUN 41* 37* 34* 30* 21*  °CREATININE 1.72* 1.84* 1.63* 1.67* 1.20  °CALCIUM 9.3 9.0 8.9 8.7* 8.0*  °MG  --   --  2.8*  --   --   ° °Liver Function Tests: °Recent Labs  °Lab 08/16/18 °1729  °AST 26  °ALT 42  °ALKPHOS 146*  °BILITOT 1.3*  °PROT 9.6*  °ALBUMIN 4.7  ° °Recent Labs  °Lab 08/16/18 °1946  °LIPASE 48  ° °No results for input(s): AMMONIA in the last 168 hours. °CBC: °Recent Labs  °Lab 08/16/18 °1729 08/16/18 °1746  °WBC 12.8*  --   °NEUTROABS 11.1*  --   °HGB 16.9 17.7*  °HCT 56.7* 52.0  °MCV 89.7  --   °PLT 463*  --   ° °Cardiac Enzymes: °Recent Labs  °Lab 08/16/18 °1947 08/17/18 °0217 08/18/18 °0230 08/19/18 °0544  °CKTOTAL 1,269* 2,226* 2,885* 836*  ° °BNP: °BNP (last 3 results) °No results for input(s): BNP in the last 8760 hours. ° °ProBNP (last 3 results) °No results for input(s): PROBNP in the last 8760 hours. ° °CBG: °Recent Labs  °Lab 08/18/18 °0746 08/18/18 °1132 08/18/18 °1634 08/18/18 °2107 08/19/18 °0752  °GLUCAP 446* 393* 321* 189* 260*  ° ° ° ° ° °Signed: ° °Carole N Hall, MD °[facility] °08/19/2018, 10:09 AM °

## 2018-08-19 NOTE — Discharge Instructions (Signed)
Insulin Injection Instructions, Using Insulin Pens, Adult A subcutaneous injection is a shot of medicine that is injected into the layer of fat and tissue between skin and muscle. People with type 1 diabetes must take insulin because their bodies do not make it. People with type 2 diabetes may need to take insulin. There are many different types of insulin. The type of insulin that you take may determine how many injections you give yourself and when you need to give the injections. Supplies needed:  Soap and water to wash hands.  Your insulin pen.  A new, unused needle.  Alcohol wipes.  A disposal container that is meant for sharp items (sharps container), such as an empty plastic bottle with a cover. How to choose a site for injection The body absorbs insulin differently, depending on where the insulin is injected (injection site). It is best to inject insulin into the same body area each time (for example, always in the abdomen), but you should use a different spot in that area for each injection. Do not inject the insulin in the same spot each time. There are five main areas that can be used for injecting. These areas include:  Abdomen. This is the preferred area.  Front of thigh.  Upper, outer side of thigh.  Upper, outer side of arm.  Upper, outer part of buttock. How to use an insulin pen  First, follow the steps for Get ready, then continue with the steps for Inject the insulin. Get ready 1. Wash your hands with soap and water. If soap and water are not available, use hand sanitizer. 2. Before you give yourself an insulin injection, be sure to test your blood sugar level (blood glucose level) and write down that number. Follow any instructions from your health care provider about what to do if your blood glucose level is higher or lower than your normal range. 3. Check the expiration date and the type of insulin that is in the pen. 4. If you are using CLEAR insulin, check to  see that it is clear and free of clumps. 5. If you are using CLOUDY insulin, do not shake the pen to get the injection ready. Instead, get it ready in one of these ways: ? Gently roll the pen between your palms several times. ? Tip the pen up and down several times. 6. Remove the cap from the insulin pen. 7. Use an alcohol wipe to clean the rubber tip of the pen. 8. Remove the protective paper tab from the disposable needle. Do not let the needle touch anything. 9. Screw a new, unused needle onto the pen. 10. Remove the outer plastic needle cover. Do not throw away the outer plastic cover yet. ? If the pen uses a special safety needle, leave the inner needle shield in place. ? If the pen does not use a special safety needle, remove the inner plastic cover from the needle. 11. Follow the manufacturer's instructions to prime the insulin pen with the volume of insulin needed. Hold the pen with the needle pointing up, and push the button on the opposite end of the pen until a drop of insulin appears at the needle tip. If no insulin appears, repeat this step. 12. Turn the button (dial) to the number of units of insulin that you will be injecting. Inject the insulin 1. Use an alcohol wipe to clean the site where you will be injecting the needle. Let the site air-dry. 2. Hold the pen in the  palm of your writing hand like a pencil. 3. If directed by your health care provider, use your other hand to pinch and hold about an inch (2.5 cm) of skin at the injection site. Do not directly touch the cleaned part of the skin. 4. Gently but quickly, use your writing hand to put the needle straight into the skin. The needle should be at a 90-degree angle (perpendicular) to the skin. 5. When the needle is completely inserted into the skin, use your thumb or index finger of your writing hand to push the top button of the pen down all the way to inject the insulin. 6. Let go of the skin that you are pinching. Continue  to hold the pen in place with your writing hand. 7. Wait 10 seconds, then pull the needle straight out of the skin. This will allow all of the insulin to go from the pen and needle into your body. 8. Carefully put the larger (outer) plastic cover of the needle back over the needle, then unscrew the capped needle and discard it in a sharps container, such as an empty plastic bottle with a cover. 9. Put the plastic cap back on the insulin pen. How to throw away supplies  Discard all used needles in a puncture-proof sharps disposal container. You can ask your local pharmacy about where you can get this kind of disposal container, or you can use an empty plastic liquid laundry detergent bottle that has a cover.  Follow the disposal regulations for the area where you live. Do not use any needle more than one time.  Throw away empty disposable pens in the regular trash. Questions to ask your health care provider  How often should I be taking insulin?  How often should I check my blood glucose?  What amount of insulin should I be taking at each time?  What are the side effects?  What should I do if my blood glucose is too high?  What should I do if my blood glucose is too low?  What should I do if I forget to take my insulin?  What number should I call if I have questions? Where to find more information  American Diabetes Association (ADA): www.diabetes.org  American Association of Diabetes Educators (AADE) Patient Resources: https://www.diabeteseducator.org Summary  A subcutaneous injection is a shot of medicine that is injected into the layer of fat and tissue between skin and muscle.  Before you give yourself an insulin injection, be sure to test your blood sugar level (blood glucose level) and write down that number.  Check the expiration date and the type of insulin that is in the pen. The type of insulin that you take may determine how many injections you give yourself and when  you need to give the injections.  It is best to inject insulin into the same body area each time (for example, always in the abdomen), but you should use a different spot in that area for each injection. This information is not intended to replace advice given to you by your health care provider. Make sure you discuss any questions you have with your health care provider. Document Released: 03/02/2015 Document Revised: 02/16/2017 Document Reviewed: 03/02/2015 Elsevier Patient Education  2020 Elsevier Inc. Hyperglycemia Hyperglycemia is when the sugar (glucose) level in your blood is too high. It may not cause symptoms. If you do have symptoms, they may include warning signs, such as:  Feeling more thirsty than normal.  Hunger.  Feeling tired.  Needing to pee (urinate) more than normal.  Blurry eyesight (vision). You may get other symptoms as it gets worse, such as:  Dry mouth.  Not being hungry (loss of appetite).  Fruity-smelling breath.  Weakness.  Weight gain or loss that is not planned. Weight loss may be fast.  A tingling or numb feeling in your hands or feet.  Headache.  Skin that does not bounce back quickly when it is lightly pinched and released (poor skin turgor).  Pain in your belly (abdomen).  Cuts or bruises that heal slowly. High blood sugar can happen to people who do or do not have diabetes. High blood sugar can happen slowly or quickly, and it can be an emergency. Follow these instructions at home: General instructions  Take over-the-counter and prescription medicines only as told by your doctor.  Do not use products that contain nicotine or tobacco, such as cigarettes and e-cigarettes. If you need help quitting, ask your doctor.  Limit alcohol intake to no more than 1 drink per day for nonpregnant women and 2 drinks per day for men. One drink equals 12 oz of beer, 5 oz of wine, or 1 oz of hard liquor.  Manage stress. If you need help with this, ask  your doctor.  Keep all follow-up visits as told by your doctor. This is important. Eating and drinking   Stay at a healthy weight.  Exercise regularly, as told by your doctor.  Drink enough fluid, especially when you: ? Exercise. ? Get sick. ? Are in hot temperatures.  Eat healthy foods, such as: ? Low-fat (lean) proteins. ? Complex carbs (complex carbohydrates), such as whole wheat bread or brown rice. ? Fresh fruits and vegetables. ? Low-fat dairy products. ? Healthy fats.  Drink enough fluid to keep your pee (urine) clear or pale yellow. If you have diabetes:   Make sure you know the symptoms of hyperglycemia.  Follow your diabetes management plan, as told by your doctor. Make sure you: ? Take insulin and medicines as told. ? Follow your exercise plan. ? Follow your meal plan. Eat on time. Do not skip meals. ? Check your blood sugar as often as told. Make sure to check before and after exercise. If you exercise longer or in a different way than you normally do, check your blood sugar more often. ? Follow your sick day plan whenever you cannot eat or drink normally. Make this plan ahead of time with your doctor.  Share your diabetes management plan with people in your workplace, school, and household.  Check your urine for ketones when you are ill and as told by your doctor.  Carry a card or wear jewelry that says that you have diabetes. Contact a doctor if:  Your blood sugar level is higher than 240 mg/dL (13.3 mmol/L) for 2 days in a row.  You have problems keeping your blood sugar in your target range.  High blood sugar happens often for you. Get help right away if:  You have trouble breathing.  You have a change in how you think, feel, or act (mental status).  You feel sick to your stomach (nauseous), and that feeling does not go away.  You cannot stop throwing up (vomiting). These symptoms may be an emergency. Do not wait to see if the symptoms will go  away. Get medical help right away. Call your local emergency services (911 in the [place] Do not drive yourself to the hospital. Summary  Hyperglycemia is when the sugar (  glucose) level in your blood is too high.  High blood sugar can happen to people who do or do not have diabetes.  Make sure you drink enough fluids, eat healthy foods, and exercise regularly.  Contact your doctor if you have problems keeping your blood sugar in your target range. This information is not intended to replace advice given to you by your health care provider. Make sure you discuss any questions you have with your health care provider. Document Released: 11/24/2008 Document Revised: 10/15/2015 Document Reviewed: 10/15/2015 Elsevier Patient Education  2020 Elsevier Inc.   Blood Glucose Monitoring, Adult Monitoring your blood sugar (glucose) is an important part of managing your diabetes (diabetes mellitus). Blood glucose monitoring involves checking your blood glucose as often as directed and keeping a record (log) of your results over time. Checking your blood glucose regularly and keeping a blood glucose log can:  Help you and your health care provider adjust your diabetes management plan as needed, including your medicines or insulin.  Help you understand how food, exercise, illnesses, and medicines affect your blood glucose.  Let you know what your blood glucose is at any time. You can quickly find out if you have low blood glucose (hypoglycemia) or high blood glucose (hyperglycemia). Your health care provider will set individualized treatment goals for you. Your goals will be based on your age, other medical conditions you have, and how you respond to diabetes treatment. Generally, the goal of treatment is to maintain the following blood glucose levels:  Before meals (preprandial): 80-130 mg/dL (1.6-1.04.4-7.2 mmol/L).  After meals (postprandial): below 180 mg/dL (10 mmol/L).  A1c level: less than  7%. Supplies needed:  Blood glucose meter.  Test strips for your meter. Each meter has its own strips. You must use the strips that came with your meter.  A needle to prick your finger (lancet). Do not use a lancet more than one time.  A device that holds the lancet (lancing device).  A journal or log book to write down your results. How to check your blood glucose  10. Wash your hands with soap and water. 11. Prick the side of your finger (not the tip) with the lancet. Use a different finger each time. 12. Gently rub the finger until a small drop of blood appears. 13. Follow instructions that come with your meter for inserting the test strip, applying blood to the strip, and using your blood glucose meter. 14. Write down your result and any notes. Some meters allow you to use areas of your body other than your finger (alternative sites) to test your blood. The most common alternative sites are:  Forearm.  Thigh.  Palm of the hand. If you think you may have hypoglycemia, or if you have a history of not knowing when your blood glucose is getting low (hypoglycemia unawareness), do not use alternative sites. Use your finger instead. Alternative sites may not be as accurate as the fingers, because blood flow is slower in these areas. This means that the result you get may be delayed, and it may be different from the result that you would get from your finger. Follow these instructions at home: Blood glucose log   Every time you check your blood glucose, write down your result. Also write down any notes about things that may be affecting your blood glucose, such as your diet and exercise for the day. This information can help you and your health care provider: ? Look for patterns in your blood glucose  over time. ? Adjust your diabetes management plan as needed.  Check if your meter allows you to download your records to a computer. Most glucose meters store a record of glucose readings  in the meter. If you have type 1 diabetes:  Check your blood glucose 2 or more times a day.  Also check your blood glucose: ? Before every insulin injection. ? Before and after exercise. ? Before meals. ? 2 hours after a meal. ? Occasionally between 2:00 a.m. and 3:00 a.m., as directed. ? Before potentially dangerous tasks, like driving or using heavy machinery. ? At bedtime.  You may need to check your blood glucose more often, up to 6-10 times a day, if you: ? Use an insulin pump. ? Need multiple daily injections (MDI). ? Have diabetes that is not well-controlled. ? Are ill. ? Have a history of severe hypoglycemia. ? Have hypoglycemia unawareness. If you have type 2 diabetes:  If you take insulin or other diabetes medicines, check your blood glucose 2 or more times a day.  If you are on intensive insulin therapy, check your blood glucose 4 or more times a day. Occasionally, you may also need to check between 2:00 a.m. and 3:00 a.m., as directed.  Also check your blood glucose: ? Before and after exercise. ? Before potentially dangerous tasks, like driving or using heavy machinery.  You may need to check your blood glucose more often if: ? Your medicine is being adjusted. ? Your diabetes is not well-controlled. ? You are ill. General tips  Always keep your supplies with you.  If you have questions or need help, all blood glucose meters have a 24-hour "hotline" phone number that you can call. You may also contact your health care provider.  After you use a few boxes of test strips, adjust (calibrate) your blood glucose meter by following instructions that came with your meter. Contact a health care provider if:  Your blood glucose is at or above 240 mg/dL (60.413.3 mmol/L) for 2 days in a row.  You have been sick or have had a fever for 2 days or longer, and you are not getting better.  You have any of the following problems for more than 6 hours: ? You cannot eat or  drink. ? You have nausea or vomiting. ? You have diarrhea. Get help right away if:  Your blood glucose is lower than 54 mg/dL (3 mmol/L).  You become confused or you have trouble thinking clearly.  You have difficulty breathing.  You have moderate or large ketone levels in your urine. Summary  Monitoring your blood sugar (glucose) is an important part of managing your diabetes (diabetes mellitus).  Blood glucose monitoring involves checking your blood glucose as often as directed and keeping a record (log) of your results over time.  Your health care provider will set individualized treatment goals for you. Your goals will be based on your age, other medical conditions you have, and how you respond to diabetes treatment.  Every time you check your blood glucose, write down your result. Also write down any notes about things that may be affecting your blood glucose, such as your diet and exercise for the day. This information is not intended to replace advice given to you by your health care provider. Make sure you discuss any questions you have with your health care provider. Document Released: 01/30/2003 Document Revised: 11/20/2017 Document Reviewed: 07/09/2015 Elsevier Patient Education  2020 [employer]

## 2018-08-21 LAB — CULTURE, BLOOD (ROUTINE X 2)
Culture: NO GROWTH
Culture: NO GROWTH
Special Requests: ADEQUATE
Special Requests: ADEQUATE

## 2018-09-08 ENCOUNTER — Inpatient Hospital Stay: Payer: BC Managed Care – PPO | Admitting: Family Medicine

## 2018-09-24 ENCOUNTER — Inpatient Hospital Stay: Payer: BC Managed Care – PPO | Admitting: Family Medicine

## 2018-10-04 ENCOUNTER — Encounter: Payer: Self-pay | Admitting: Pharmacist

## 2018-10-04 ENCOUNTER — Other Ambulatory Visit: Payer: Self-pay

## 2018-10-04 ENCOUNTER — Encounter: Payer: Self-pay | Admitting: Family Medicine

## 2018-10-04 ENCOUNTER — Ambulatory Visit: Payer: BC Managed Care – PPO | Attending: Family Medicine | Admitting: Pharmacist

## 2018-10-04 ENCOUNTER — Ambulatory Visit: Payer: BC Managed Care – PPO | Attending: Family Medicine | Admitting: Family Medicine

## 2018-10-04 VITALS — BP 146/88 | HR 70 | Temp 98.5°F | Ht 73.0 in | Wt 206.0 lb

## 2018-10-04 DIAGNOSIS — E1165 Type 2 diabetes mellitus with hyperglycemia: Secondary | ICD-10-CM | POA: Diagnosis not present

## 2018-10-04 DIAGNOSIS — I10 Essential (primary) hypertension: Secondary | ICD-10-CM

## 2018-10-04 DIAGNOSIS — E119 Type 2 diabetes mellitus without complications: Secondary | ICD-10-CM

## 2018-10-04 LAB — GLUCOSE, POCT (MANUAL RESULT ENTRY): POC Glucose: 97 mg/dl (ref 70–99)

## 2018-10-04 NOTE — Progress Notes (Signed)
Patient is fasting. 

## 2018-10-04 NOTE — Progress Notes (Signed)
Patient was educated on the use of the Myplate method and its role in DM control. Reviewed food groups to avoid/limit. Also reviewed goal blood glucose levels. Recommended at least 150 minutes of aerobic exercise weekly. All questions and concerns were addressed.

## 2018-10-05 LAB — CMP14+EGFR
ALT: 29 IU/L (ref 0–44)
AST: 22 IU/L (ref 0–40)
Albumin/Globulin Ratio: 2.1 (ref 1.2–2.2)
Albumin: 4.8 g/dL (ref 4.0–5.0)
Alkaline Phosphatase: 116 IU/L (ref 39–117)
BUN/Creatinine Ratio: 9 (ref 9–20)
BUN: 12 mg/dL (ref 6–24)
Bilirubin Total: 0.3 mg/dL (ref 0.0–1.2)
CO2: 25 mmol/L (ref 20–29)
Calcium: 9.7 mg/dL (ref 8.7–10.2)
Chloride: 102 mmol/L (ref 96–106)
Creatinine, Ser: 1.32 mg/dL — ABNORMAL HIGH (ref 0.76–1.27)
GFR calc Af Amer: 73 mL/min/{1.73_m2} (ref >59)
GFR calc non Af Amer: 63 mL/min/{1.73_m2} (ref >59)
Globulin, Total: 2.3 g/dL (ref 1.5–4.5)
Glucose: 69 mg/dL (ref 65–99)
Potassium: 4.2 mmol/L (ref 3.5–5.2)
Sodium: 142 mmol/L (ref 134–144)
Total Protein: 7.1 g/dL (ref 6.0–8.5)

## 2018-10-05 LAB — MICROALBUMIN / CREATININE URINE RATIO
Creatinine, Urine: 112.3 mg/dL
Microalb/Creat Ratio: 59 mg/g creat — ABNORMAL HIGH (ref 0–29)
Microalbumin, Urine: 66.2 ug/mL

## 2018-10-05 LAB — LIPID PANEL
Chol/HDL Ratio: 5.6 ratio — ABNORMAL HIGH (ref 0.0–5.0)
Cholesterol, Total: 262 mg/dL — ABNORMAL HIGH (ref 100–199)
HDL: 47 mg/dL (ref >39)
LDL Calculated: 168 mg/dL — ABNORMAL HIGH (ref 0–99)
Triglycerides: 233 mg/dL — ABNORMAL HIGH (ref 0–149)
VLDL Cholesterol Cal: 47 mg/dL — ABNORMAL HIGH (ref 5–40)

## 2018-10-05 NOTE — Progress Notes (Signed)
Subjective:  Patient ID: Patrick Moody, male    DOB: September 12, 1970  Age: 47 y.o. MRN: [id number]  CC: Hospitalization Follow-up   HPI Patrick Moody is a 48 year old male who presents today to establish care after recent hospitalization at [facility] for DKA at which time he was diagnosed with newly diagnosed type 2 diabetes mellitus.  Blood sugar was greater than 1128, he had lactic acidosis with associated acute kidney injury with a creatinine of 2.8. Treated with IV fluid and IV insulin which was transitioned to Lantus and NovoLog; creatinine normalized to 1.2 at discharge  Today he informs me he takes Lantus every other day about 4 times a week due to his blood sugars being low.  He has consistently taking his NovoLog 12 units 3 times daily. He did receive diabetic teaching during hospitalization but feels he could use some additional teaching. He has a scab on the left calf which is pruritic and he would like this checked out.  He has been applying cortisone cream to it.  Past Medical History:  Diagnosis Date  . Hypertension   . Tobacco abuse     History reviewed. No pertinent surgical history.  Family History  Problem Relation Age of Onset  . Diabetes Mellitus II Mother   . Hypertension Father     No Known Allergies  Outpatient Medications Prior to Visit  Medication Sig Dispense Refill  . amLODipine (NORVASC) 10 MG tablet Take 1 tablet (10 mg total) by mouth daily. 30 tablet 0  . atorvastatin (LIPITOR) 40 MG tablet Take 1 tablet (40 mg total) by mouth daily at 6 PM. 30 tablet 0  . blood glucose meter kit and supplies KIT Dispense based on patient and insurance preference. Use up to four times daily as directed. (FOR ICD-9 250.00, 250.01). 1 each 0  . insulin aspart (NOVOLOG) 100 UNIT/ML FlexPen Inject 12 Units into the skin 3 (three) times daily with meals. 15 mL 0  . Insulin Glargine (LANTUS) 100 UNIT/ML Solostar Pen Inject 30 Units into the skin daily. 15 mL 0  .  metoprolol tartrate (LOPRESSOR) 25 MG tablet Take 0.5 tablets (12.5 mg total) by mouth 2 (two) times daily. 60 tablet 0  . nicotine (NICODERM CQ - DOSED IN MG/24 HOURS) 21 mg/24hr patch Place 1 patch (21 mg total) onto the skin daily. (Patient not taking: Reported on 10/04/2018) 28 patch 0   No facility-administered medications prior to visit.      ROS Review of Systems  Constitutional: Negative for activity change and appetite change.  HENT: Negative for sinus pressure and sore throat.   Eyes: Negative for visual disturbance.  Respiratory: Negative for cough, chest tightness and shortness of breath.   Cardiovascular: Negative for chest pain and leg swelling.  Gastrointestinal: Negative for abdominal distention, abdominal pain, constipation and diarrhea.  Endocrine: Negative.   Genitourinary: Negative for dysuria.  Musculoskeletal: Negative for joint swelling and myalgias.  Skin: Positive for rash.  Allergic/Immunologic: Negative.   Neurological: Negative for weakness, light-headedness and numbness.  Psychiatric/Behavioral: Negative for dysphoric mood and suicidal ideas.    Objective:  BP (!) 146/88   Pulse 70   Temp 98.5 F (36.9 C) (Oral)   Ht 6' 1" (1.854 m)   Wt 206 lb (93.4 kg)   SpO2 98%   BMI 27.18 kg/m   BP/Weight 10/04/2018 08/10/6965 09/18/3808  Systolic BP 175 102 -  Diastolic BP 88 88 -  Wt. (Lbs) 206 - 168  BMI 27.18 - 22.16  Physical Exam Constitutional:      Appearance: He is well-developed.  Cardiovascular:     Rate and Rhythm: Normal rate.     Heart sounds: Normal heart sounds. No murmur.  Pulmonary:     Effort: Pulmonary effort is normal.     Breath sounds: Normal breath sounds. No wheezing or rales.  Chest:     Chest wall: No tenderness.  Abdominal:     General: Bowel sounds are normal. There is no distension.     Palpations: Abdomen is soft. There is no mass.     Tenderness: There is no abdominal tenderness.  Musculoskeletal: Normal range  of motion.  Skin:    Comments: Left lower occult scab, no evidence of infection  Neurological:     Mental Status: He is alert and oriented to person, place, and time.     CMP Latest Ref Rng & Units 10/04/2018 08/19/2018 08/18/2018  Glucose 65 - 99 mg/dL 69 255(H) 327(H)  BUN 6 - 24 mg/dL 12 21(H) 30(H)  Creatinine 0.76 - 1.27 mg/dL 1.32(H) 1.20 1.67(H)  Sodium 134 - 144 mmol/L 142 142 151(H)  Potassium 3.5 - 5.2 mmol/L 4.2 3.8 4.5  Chloride 96 - 106 mmol/L 102 114(H) 126(H)  CO2 20 - 29 mmol/L 25 22 18(L)  Calcium 8.7 - 10.2 mg/dL 9.7 8.0(L) 8.7(L)  Total Protein 6.0 - 8.5 g/dL 7.1 - -  Total Bilirubin 0.0 - 1.2 mg/dL 0.3 - -  Alkaline Phos 39 - 117 IU/L 116 - -  AST 0 - 40 IU/L 22 - -  ALT 0 - 44 IU/L 29 - -    Lipid Panel     Component Value Date/Time   CHOL 262 (H) 10/04/2018 1345   TRIG 233 (H) 10/04/2018 1345   HDL 47 10/04/2018 1345   CHOLHDL 5.6 (H) 10/04/2018 1345   CHOLHDL 9.9 08/17/2018 0217   VLDL UNABLE TO CALCULATE IF TRIGLYCERIDE OVER 400 mg/dL 08/17/2018 0217   LDLCALC 168 (H) 10/04/2018 1345   LDLDIRECT 274.5 (H) 08/17/2018 0217    CBC    Component Value Date/Time   WBC 12.8 (H) 08/16/2018 1729   RBC 6.32 (H) 08/16/2018 1729   HGB 17.7 (H) 08/16/2018 1746   HCT 52.0 08/16/2018 1746   PLT 463 (H) 08/16/2018 1729   MCV 89.7 08/16/2018 1729   MCH 26.7 08/16/2018 1729   MCHC 29.8 (L) 08/16/2018 1729   RDW 12.6 08/16/2018 1729   LYMPHSABS 0.9 08/16/2018 1729   MONOABS 0.7 08/16/2018 1729   EOSABS 0.0 08/16/2018 1729   BASOSABS 0.0 08/16/2018 1729    Lab Results  Component Value Date   HGBA1C 12.8 (H) 08/17/2018    Assessment & Plan:   1. Newly diagnosed diabetes ([place]) Uncontrolled with A1c of 12.8 Advised to administer Lantus consistently 30 units; switch from fixed NovoLog dosing to sliding scale Advised to reduce Lantus by 2 units in the event of hypoglycemia Clinical pharmacist called in for diabetic teaching Counseled on Diabetic diet, my  plate method, 001 minutes of moderate intensity exercise/week Keep blood sugar logs with fasting goals of 80-120 mg/dl, random of less than 180 and in the event of sugars less than 60 mg/dl or greater than 400 mg/dl please notify the clinic ASAP. It is recommended that you undergo annual eye exams and annual foot exams. Pneumonia vaccine is recommended. - POCT glucose (manual entry) - Lipid panel - CMP14+EGFR - Microalbumin / creatinine urine ratio - Ambulatory referral to Ophthalmology  2. Essential hypertension,  benign Slightly elevated above goal No regimen change today   Advised against itching scab on leg; use OTC hydrocortisone cream No orders of the defined types were placed in this encounter.   Follow-up: Return in about 3 months (around 01/04/2019) for Diabetes.       Charlott Rakes, MD, FAAFP. [facility] and [facility], [place]   10/05/2018, 8:08 AM

## 2018-10-07 ENCOUNTER — Telehealth: Payer: Self-pay

## 2018-10-07 NOTE — Telephone Encounter (Signed)
Patient was called and a voicemail was left informing patient to return phone call for lab results. 

## 2018-10-07 NOTE — Telephone Encounter (Signed)
-----   Message from Charlott Rakes, MD sent at 10/06/2018  1:24 PM EDT ----- Labs reveal elevated cholesterol.  He was commenced on Lipitor at his last visit and I will encourage him to comply with this as well as a low-cholesterol diet.  Other labs are stable

## 2018-12-22 ENCOUNTER — Telehealth: Payer: Self-pay | Admitting: General Practice

## 2018-12-22 MED ORDER — INSULIN GLARGINE 100 UNIT/ML SOLOSTAR PEN: 30.0000 [IU] | PEN_INJECTOR | Freq: Every day | SUBCUTANEOUS | 0 refills | Status: DC

## 2018-12-22 NOTE — Telephone Encounter (Signed)
Refills sent

## 2018-12-22 NOTE — Telephone Encounter (Signed)
1) Medication(s) Requested (by name):Insulin Glargine (LANTUS) 100 UNIT/ML Solostar Pen    2) Pharmacy of Choice::  [id number] - [place], [place]  3) Special Requests:   Approved medications will be sent to the pharmacy, we will reach out if there is an issue.  Requests made after 3pm may not be addressed until the following business day!  If a patient is unsure of the name of the medication(s) please note and ask patient to call back when they are able to provide all info, do not send to responsible party until all information is available!

## 2019-01-04 ENCOUNTER — Encounter: Payer: Self-pay | Admitting: Family Medicine

## 2019-01-04 ENCOUNTER — Ambulatory Visit: Payer: BC Managed Care – PPO | Attending: Family Medicine | Admitting: Family Medicine

## 2019-01-04 ENCOUNTER — Other Ambulatory Visit: Payer: Self-pay

## 2019-01-04 VITALS — BP 174/98 | HR 76 | Temp 98.9°F | Resp 18 | Ht 73.0 in | Wt 207.0 lb

## 2019-01-04 DIAGNOSIS — L84 Corns and callosities: Secondary | ICD-10-CM | POA: Diagnosis not present

## 2019-01-04 DIAGNOSIS — E11649 Type 2 diabetes mellitus with hypoglycemia without coma: Secondary | ICD-10-CM | POA: Diagnosis not present

## 2019-01-04 DIAGNOSIS — E1169 Type 2 diabetes mellitus with other specified complication: Secondary | ICD-10-CM | POA: Diagnosis not present

## 2019-01-04 DIAGNOSIS — E785 Hyperlipidemia, unspecified: Secondary | ICD-10-CM

## 2019-01-04 DIAGNOSIS — I10 Essential (primary) hypertension: Secondary | ICD-10-CM | POA: Diagnosis not present

## 2019-01-04 DIAGNOSIS — Z794 Long term (current) use of insulin: Secondary | ICD-10-CM

## 2019-01-04 LAB — POCT GLYCOSYLATED HEMOGLOBIN (HGB A1C): Hemoglobin A1C: 6.3 % — AB (ref 4.0–5.6)

## 2019-01-04 LAB — GLUCOSE, POCT (MANUAL RESULT ENTRY): POC Glucose: 83 mg/dl (ref 70–99)

## 2019-01-04 MED ORDER — AMLODIPINE BESYLATE 10 MG PO TABS: 10.0000 mg | ORAL_TABLET | Freq: Every day | ORAL | 6 refills | Status: AC

## 2019-01-04 MED ORDER — METOPROLOL TARTRATE 25 MG PO TABS: 12.5000 mg | ORAL_TABLET | Freq: Two times a day (BID) | ORAL | 6 refills | Status: AC

## 2019-01-04 MED ORDER — INSULIN ASPART 100 UNIT/ML FLEXPEN: 0.0000 [IU] | PEN_INJECTOR | Freq: Three times a day (TID) | SUBCUTANEOUS | 6 refills | Status: AC

## 2019-01-04 MED ORDER — INSULIN GLARGINE 100 UNIT/ML SOLOSTAR PEN: 24.0000 [IU] | PEN_INJECTOR | Freq: Every day | SUBCUTANEOUS | 6 refills | Status: AC

## 2019-01-04 MED ORDER — ATORVASTATIN CALCIUM 40 MG PO TABS: 40.0000 mg | ORAL_TABLET | Freq: Every day | ORAL | 6 refills | Status: AC

## 2019-01-04 NOTE — Progress Notes (Signed)
.  [phone]

## 2019-01-04 NOTE — Progress Notes (Signed)
Subjective:  Patient ID: Patrick Moody, male    DOB: 1970/08/05  Age: 48 y.o. MRN: [id number]  CC:  Chief Complaint  Patient presents with  . Diabetes     HPI Patrick Moody is a 48 year old male with a history of type 2 diabetes mellitus (A1c 6.3), hypertension He is A1c 6.3 which is down from 12.8 previously and he has been on 28 units of Lantus at bedtime with his highest sugars at 160.  He is also on NovoLog and administers 12 units if blood sugars are greater than 150 and this rarely occurs and at the maximum he has to administer NovoLog 3 times a month.  He endorses feeling lightheaded on some occasions but has not produced any hypoglycemic blood sugar values. He has been out of Lantus for the last 2 weeks. Also out of his antihypertensive hence his elevated blood pressure. Last lipid panel was elevated and he is currently on Lipitor. He has no additional concerns today.  Past Medical History:  Diagnosis Date  . Hypertension   . Tobacco abuse     History reviewed. No pertinent surgical history.  Family History  Problem Relation Age of Onset  . Diabetes Mellitus II Mother   . Hypertension Father     No Known Allergies  Outpatient Medications Prior to Visit  Medication Sig Dispense Refill  . blood glucose meter kit and supplies KIT Dispense based on patient and insurance preference. Use up to four times daily as directed. (FOR ICD-9 250.00, 250.01). 1 each 0  . nicotine (NICODERM CQ - DOSED IN MG/24 HOURS) 21 mg/24hr patch Place 1 patch (21 mg total) onto the skin daily. (Patient not taking: Reported on 10/04/2018) 28 patch 0  . amLODipine (NORVASC) 10 MG tablet Take 1 tablet (10 mg total) by mouth daily. 30 tablet 0  . atorvastatin (LIPITOR) 40 MG tablet Take 1 tablet (40 mg total) by mouth daily at 6 PM. 30 tablet 0  . insulin aspart (NOVOLOG) 100 UNIT/ML FlexPen Inject 12 Units into the skin 3 (three) times daily with meals. 15 mL 0  . Insulin Glargine (LANTUS) 100  UNIT/ML Solostar Pen Inject 30 Units into the skin daily. 15 mL 0  . metoprolol tartrate (LOPRESSOR) 25 MG tablet Take 0.5 tablets (12.5 mg total) by mouth 2 (two) times daily. 60 tablet 0   No facility-administered medications prior to visit.      ROS Review of Systems  Constitutional: Negative for activity change and appetite change.  HENT: Negative for sinus pressure and sore throat.   Eyes: Negative for visual disturbance.  Respiratory: Negative for cough, chest tightness and shortness of breath.   Cardiovascular: Negative for chest pain and leg swelling.  Gastrointestinal: Negative for abdominal distention, abdominal pain, constipation and diarrhea.  Endocrine: Negative.   Genitourinary: Negative for dysuria.  Musculoskeletal: Negative for joint swelling and myalgias.  Skin: Negative for rash.  Allergic/Immunologic: Negative.   Neurological: Negative for weakness, light-headedness and numbness.  Psychiatric/Behavioral: Negative for dysphoric mood and suicidal ideas.    Objective:  BP (!) 174/98 (BP Location: Left Arm, Patient Position: Sitting, Cuff Size: Normal)   Pulse 76   Temp 98.9 F (37.2 C) (Oral)   Resp 18   Ht '6\' 1"'  (1.854 m)   Wt 207 lb (93.9 kg)   BMI 27.31 kg/m   BP/Weight 01/04/2019 0/25/8527 08/17/2421  Systolic BP 536 144 315  Diastolic BP 98 88 88  Wt. (Lbs) 207 206 -  BMI 27.31 27.18 -  Physical Exam Constitutional:      Appearance: He is well-developed.  Neck:     Vascular: No JVD.  Cardiovascular:     Rate and Rhythm: Normal rate.     Heart sounds: Normal heart sounds. No murmur.  Pulmonary:     Effort: Pulmonary effort is normal.     Breath sounds: Normal breath sounds. No wheezing or rales.  Chest:     Chest wall: No tenderness.  Abdominal:     General: Bowel sounds are normal. There is no distension.     Palpations: Abdomen is soft. There is no mass.     Tenderness: There is no abdominal tenderness.  Musculoskeletal: Normal  range of motion.     Right lower leg: No edema.     Left lower leg: No edema.  Neurological:     Mental Status: He is alert and oriented to person, place, and time.  Psychiatric:        Mood and Affect: Mood normal.     CMP Latest Ref Rng & Units 10/04/2018 08/19/2018 08/18/2018  Glucose 65 - 99 mg/dL 69 255(H) 327(H)  BUN 6 - 24 mg/dL 12 21(H) 30(H)  Creatinine 0.76 - 1.27 mg/dL 1.32(H) 1.20 1.67(H)  Sodium 134 - 144 mmol/L 142 142 151(H)  Potassium 3.5 - 5.2 mmol/L 4.2 3.8 4.5  Chloride 96 - 106 mmol/L 102 114(H) 126(H)  CO2 20 - 29 mmol/L 25 22 18(L)  Calcium 8.7 - 10.2 mg/dL 9.7 8.0(L) 8.7(L)  Total Protein 6.0 - 8.5 g/dL 7.1 - -  Total Bilirubin 0.0 - 1.2 mg/dL 0.3 - -  Alkaline Phos 39 - 117 IU/L 116 - -  AST 0 - 40 IU/L 22 - -  ALT 0 - 44 IU/L 29 - -    Lipid Panel     Component Value Date/Time   CHOL 262 (H) 10/04/2018 1345   TRIG 233 (H) 10/04/2018 1345   HDL 47 10/04/2018 1345   CHOLHDL 5.6 (H) 10/04/2018 1345   CHOLHDL 9.9 08/17/2018 0217   VLDL UNABLE TO CALCULATE IF TRIGLYCERIDE OVER 400 mg/dL 08/17/2018 0217   LDLCALC 168 (H) 10/04/2018 1345   LDLDIRECT 274.5 (H) 08/17/2018 0217    CBC    Component Value Date/Time   WBC 12.8 (H) 08/16/2018 1729   RBC 6.32 (H) 08/16/2018 1729   HGB 17.7 (H) 08/16/2018 1746   HCT 52.0 08/16/2018 1746   PLT 463 (H) 08/16/2018 1729   MCV 89.7 08/16/2018 1729   MCH 26.7 08/16/2018 1729   MCHC 29.8 (L) 08/16/2018 1729   RDW 12.6 08/16/2018 1729   LYMPHSABS 0.9 08/16/2018 1729   MONOABS 0.7 08/16/2018 1729   EOSABS 0.0 08/16/2018 1729   BASOSABS 0.0 08/16/2018 1729    Lab Results  Component Value Date   HGBA1C 6.3 (A) 01/04/2019    Assessment & Plan:   1. Type 2 diabetes mellitus with hypoglycemia without coma, with long-term current use of insulin (HCC) Controlled with A1c of 6.3; goal is less than 7 This is a significant improvement from 12.8 Decrease Lantus to 24 units from 28 units at bedtime to prevent  hypoglycemia Discussed management of hypoglycemia including down titration of Lantus by 2 units Continue lifestyle modifications, diabetic - HgB A1c - Glucose (CBG) - Insulin Glargine (LANTUS) 100 UNIT/ML Solostar Pen; Inject 24 Units into the skin daily.  Dispense: 30 mL; Refill: 6 - insulin aspart (NOVOLOG) 100 UNIT/ML FlexPen; Inject 0-12 Units into the skin 3 (three) times daily with  meals. According to sliding scale  Dispense: 30 mL; Refill: 6 - Ambulatory referral to Podiatry  2. Essential hypertension, benign Uncontrolled He has been out of medications which have refilled Counseled on blood pressure goal of less than 130/80, low-sodium, DASH diet, medication compliance, 150 minutes of moderate intensity exercise per week. Discussed medication compliance, adverse effects. - amLODipine (NORVASC) 10 MG tablet; Take 1 tablet (10 mg total) by mouth daily.  Dispense: 30 tablet; Refill: 6 - metoprolol tartrate (LOPRESSOR) 25 MG tablet; Take 0.5 tablets (12.5 mg total) by mouth 2 (two) times daily.  Dispense: 60 tablet; Refill: 6  3. Dyslipidemia associated with type 2 diabetes mellitus (HCC) Uncontrolled Low-cholesterol diet Lipid panel at next visit - atorvastatin (LIPITOR) 40 MG tablet; Take 1 tablet (40 mg total) by mouth daily at 6 PM.  Dispense: 30 tablet; Refill: 6  4. Callus of foot - Ambulatory referral to Podiatry    Meds ordered this encounter  Medications  . amLODipine (NORVASC) 10 MG tablet    Sig: Take 1 tablet (10 mg total) by mouth daily.    Dispense:  30 tablet    Refill:  6  . atorvastatin (LIPITOR) 40 MG tablet    Sig: Take 1 tablet (40 mg total) by mouth daily at 6 PM.    Dispense:  30 tablet    Refill:  6  . metoprolol tartrate (LOPRESSOR) 25 MG tablet    Sig: Take 0.5 tablets (12.5 mg total) by mouth 2 (two) times daily.    Dispense:  60 tablet    Refill:  6  . Insulin Glargine (LANTUS) 100 UNIT/ML Solostar Pen    Sig: Inject 24 Units into the skin  daily.    Dispense:  30 mL    Refill:  6  . insulin aspart (NOVOLOG) 100 UNIT/ML FlexPen    Sig: Inject 0-12 Units into the skin 3 (three) times daily with meals. According to sliding scale    Dispense:  30 mL    Refill:  6    Follow-up: Return in about 3 months (around 04/06/2019) for medical conditions.       Charlott Rakes, MD, FAAFP. [facility], [place]   01/04/2019, 9:43 AM

## 2019-01-04 NOTE — Patient Instructions (Signed)

## 2019-04-06 ENCOUNTER — Other Ambulatory Visit: Payer: Self-pay

## 2019-04-06 ENCOUNTER — Ambulatory Visit: Payer: Self-pay | Attending: Family Medicine | Admitting: Family Medicine

## 2020-11-08 IMAGING — DX PORTABLE CHEST - 1 VIEW
1 series · 1 of 1 positions shown · non-contrast
Comparison: None.

CLINICAL DATA: Dyspnea

EXAM:
PORTABLE CHEST 1 VIEW

[chest ap]
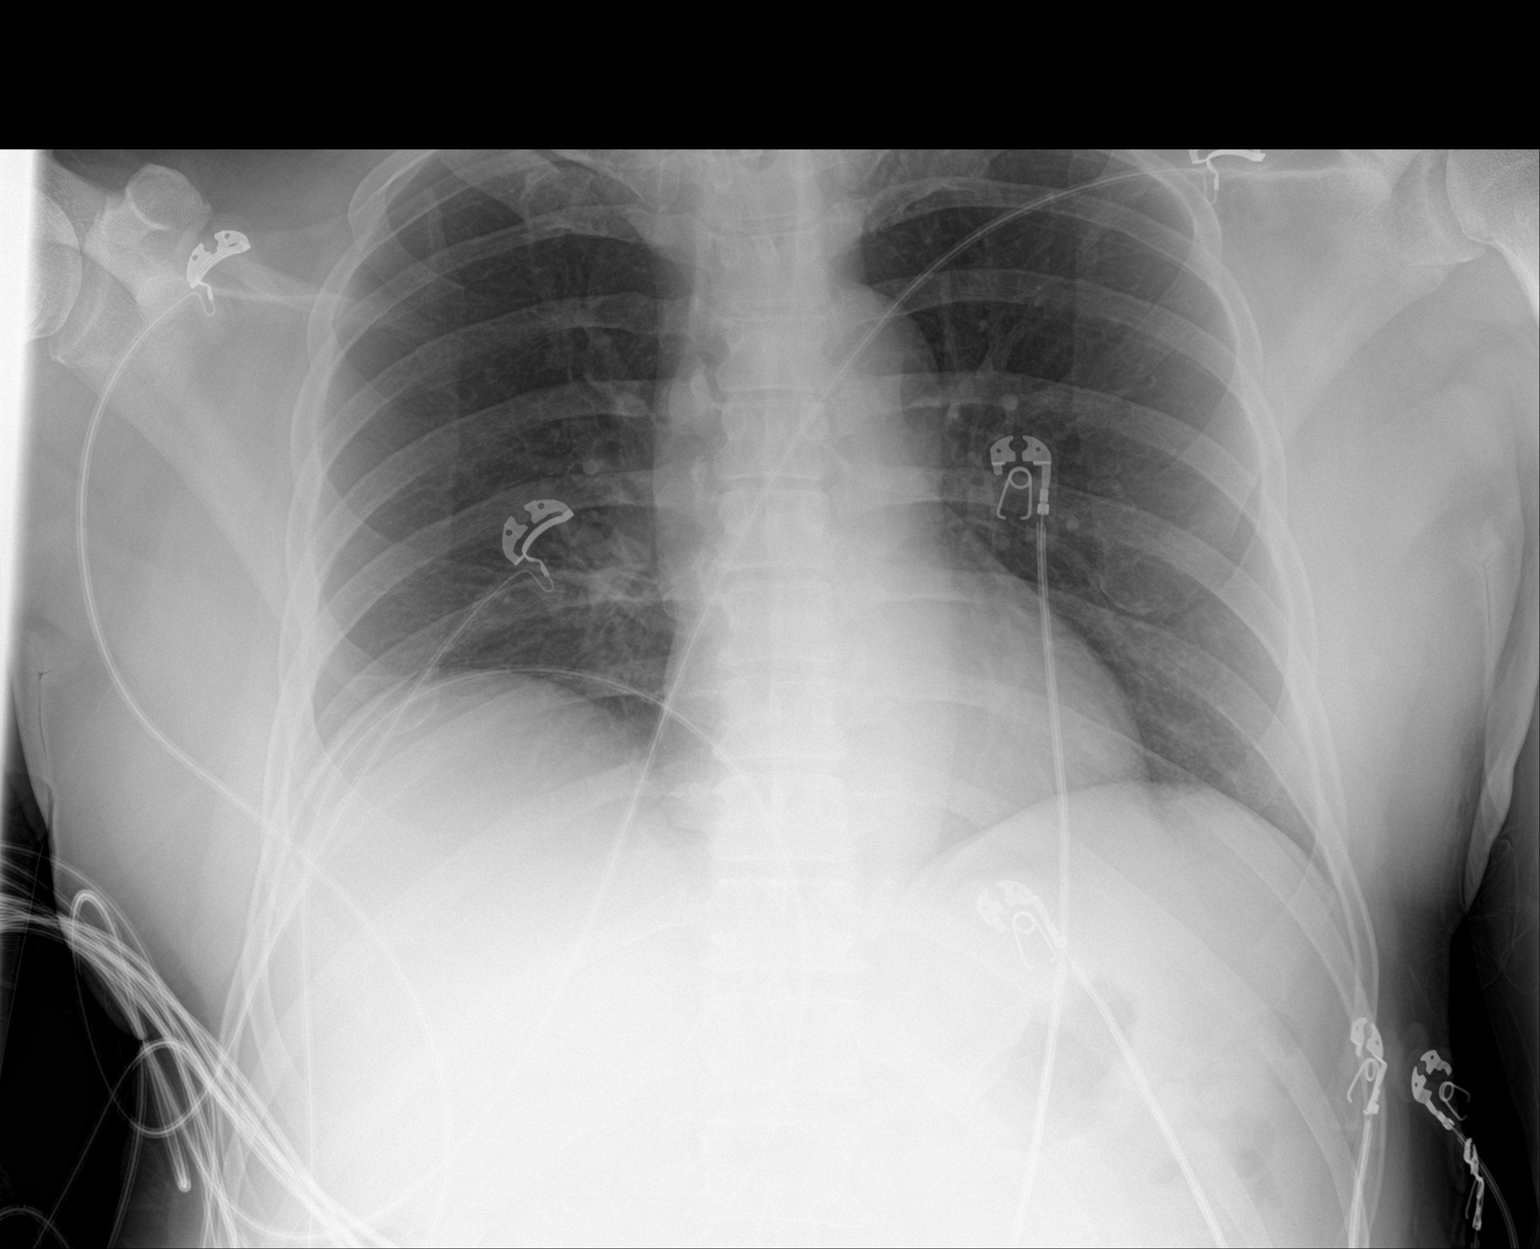

[1 of 1 positions shown; findings below may reference images not displayed]

FINDINGS: The heart size and mediastinal contours are within normal limits.
Both lungs are clear. The visualized skeletal structures are
unremarkable.
IMPRESSION: No active disease.

## 2020-11-08 IMAGING — CT CT HEAD WITHOUT CONTRAST
3 series · 16 of 47 positions shown, 19 images · non-contrast
Comparison: None.

CLINICAL DATA: Drowsiness and disorientation.

EXAM:
CT HEAD WITHOUT CONTRAST
TECHNIQUE: Contiguous axial images were obtained from the base of the skull
through the vertex without intravenous contrast.

[Series 2: head wo · axial · 0.47mm/px · z∈[-135,-5]mm · 10 of 32 slices shown, 13 images]
[im 3/32  brain]
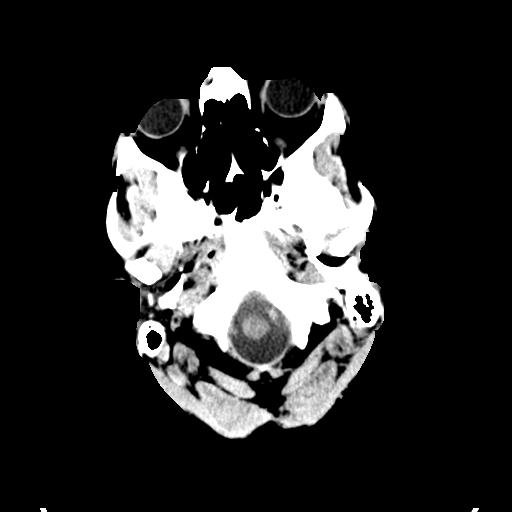
[im 3/32  bone]
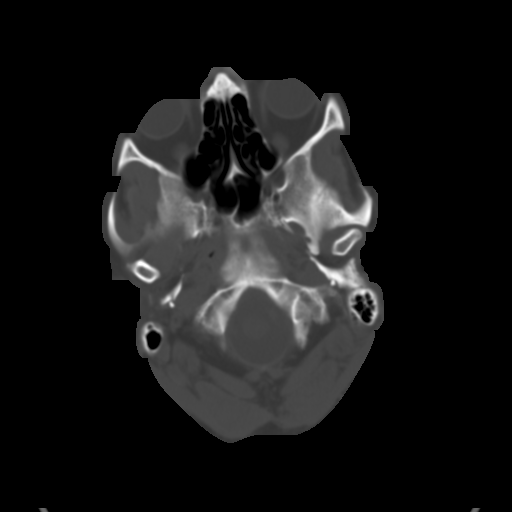
[im 6/32  brain]
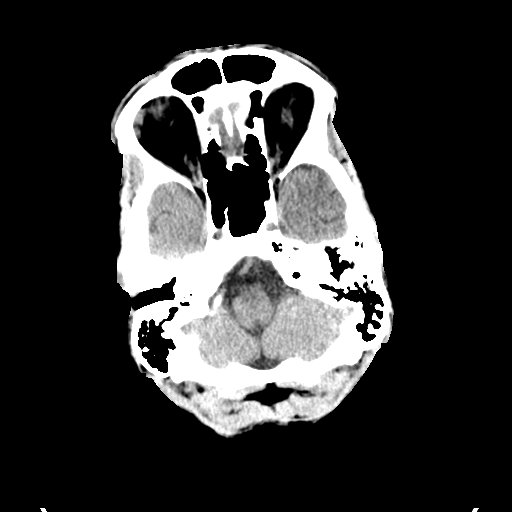
[im 9/32  brain]
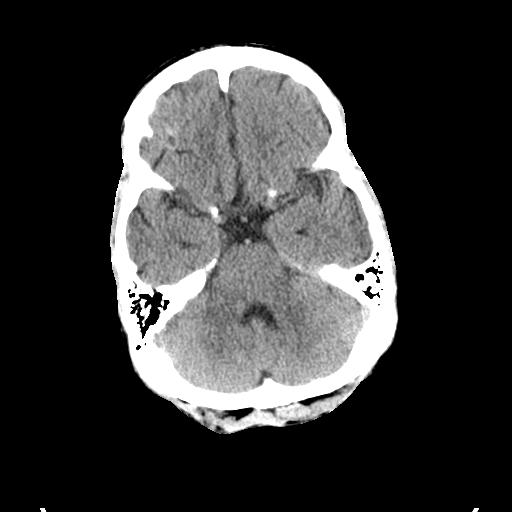
[im 11/32  brain]
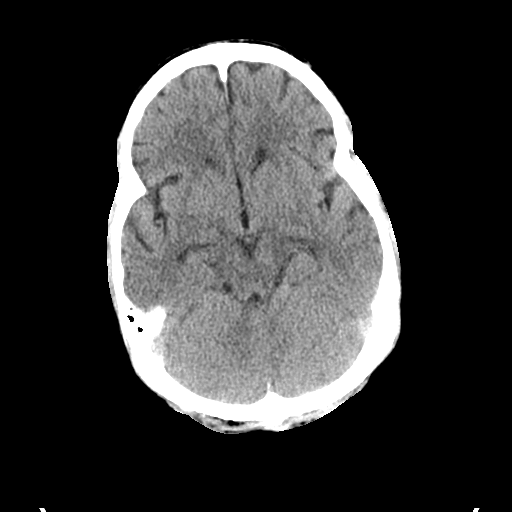
[im 14/32  brain]
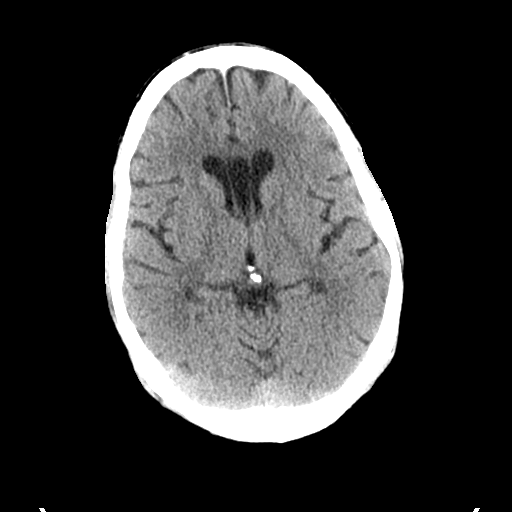
[im 14/32  bone]
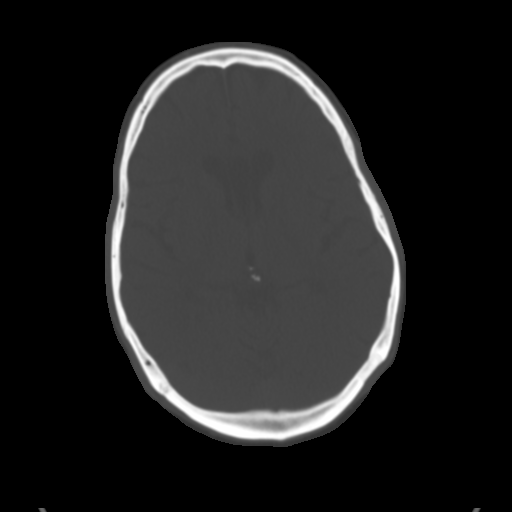
[im 18/32  brain]
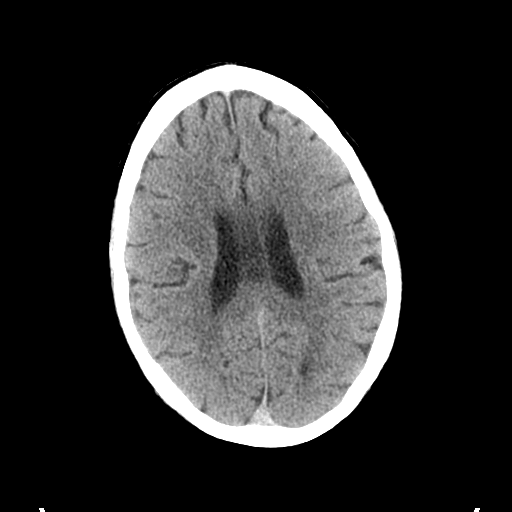
[im 21/32  brain]
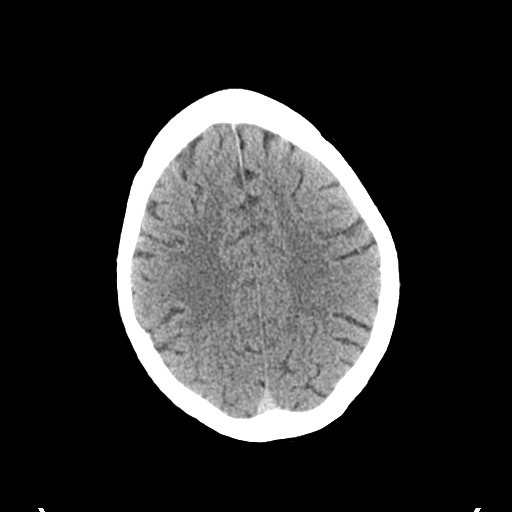
[im 24/32  brain]
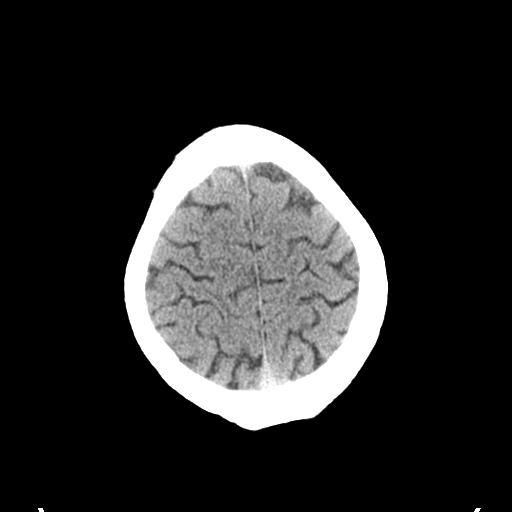
[im 26/32  brain]
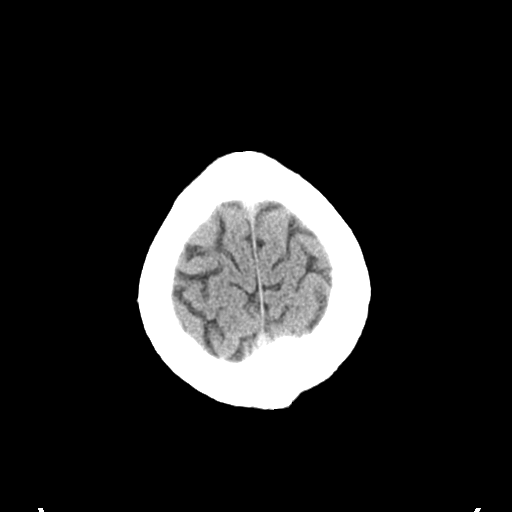
[im 26/32  bone]
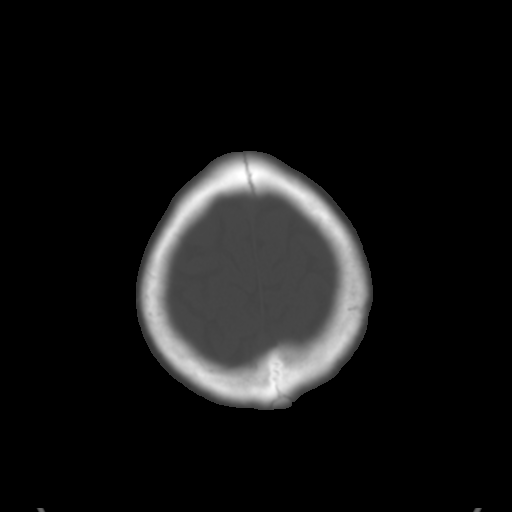
[im 29/32  brain]
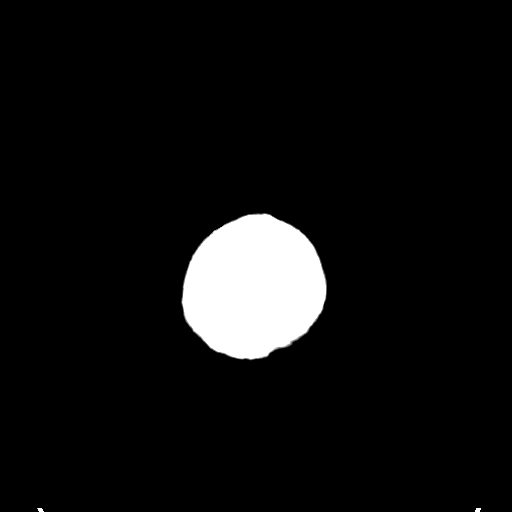

[Series 4: coronal soft tissue · coronal · 0.32mm/px · 3 of 70 slices shown]
[im 24/70  brain]
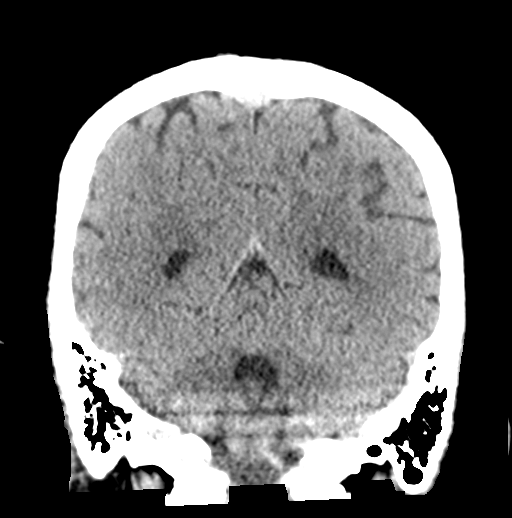
[im 31/70  brain]
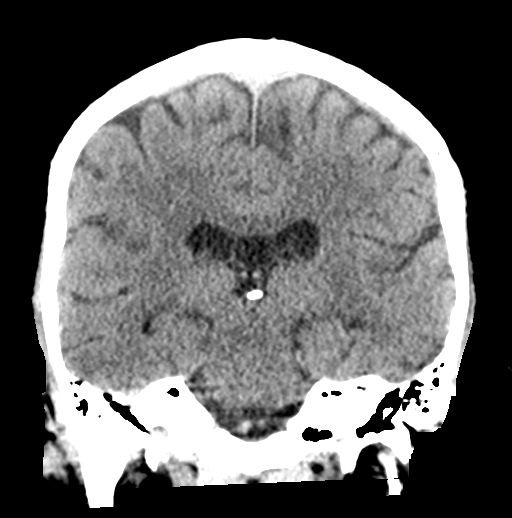
[im 39/70  brain]
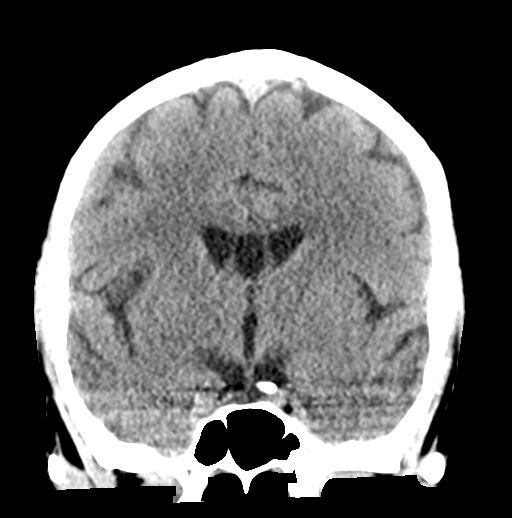

[Series 5: sagittal soft tissue · sagittal · 0.31mm/px · 3 of 50 slices shown]
[im 17/50  brain]
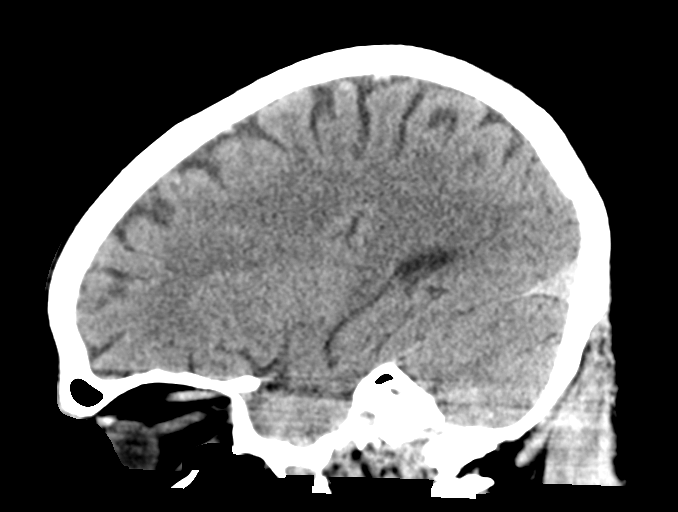
[im 25/50  brain]
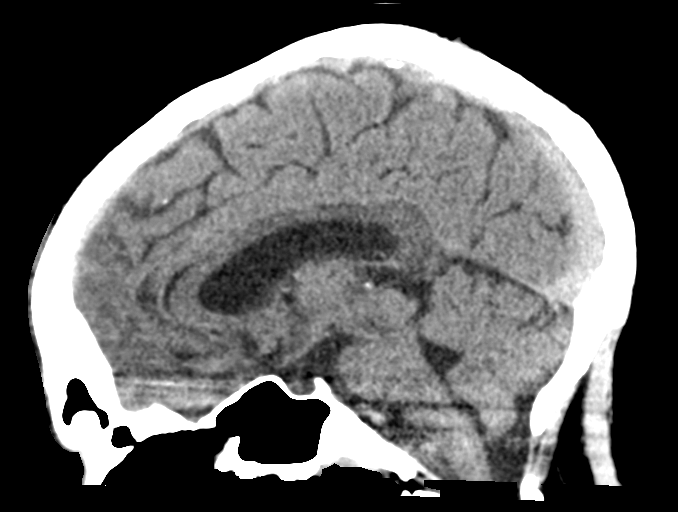
[im 33/50  brain]
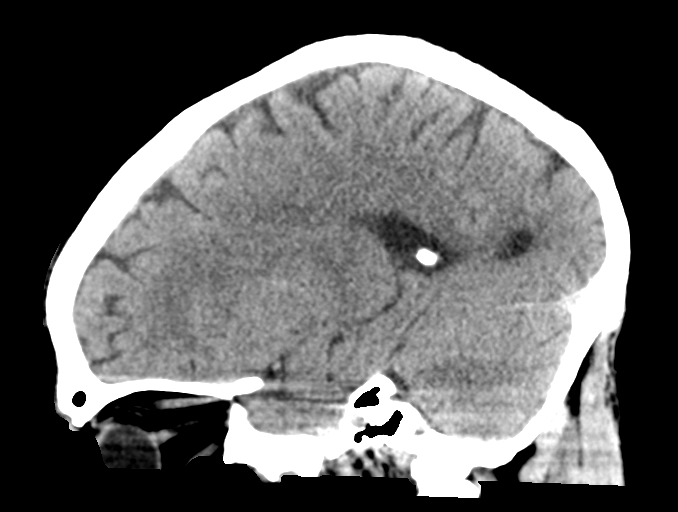

[16 of 47 positions shown; findings below may reference images not displayed]

FINDINGS: Brain: No evidence of acute infarction, hemorrhage, hydrocephalus,
extra-axial collection or mass lesion/mass effect.

Vascular: No hyperdense vessel or unexpected calcification.

Skull: Normal. Negative for fracture or focal lesion.

Sinuses/Orbits: No acute finding.

Other: None.
IMPRESSION: No acute intracranial abnormality.
